# Patient Record
Sex: Female | Born: 1940 | Race: White | Hispanic: No | Marital: Single | Smoking: Never smoker
Health system: Southern US, Community
[De-identification: ages and names within clinical notes are randomized; demographics above are authoritative.]

## PROBLEM LIST (undated history)

## (undated) DIAGNOSIS — I10 Essential (primary) hypertension: Secondary | ICD-10-CM

## (undated) DIAGNOSIS — F329 Major depressive disorder, single episode, unspecified: Secondary | ICD-10-CM

## (undated) DIAGNOSIS — M179 Osteoarthritis of knee, unspecified: Secondary | ICD-10-CM

## (undated) DIAGNOSIS — M1710 Unilateral primary osteoarthritis, unspecified knee: Secondary | ICD-10-CM

## (undated) DIAGNOSIS — E785 Hyperlipidemia, unspecified: Secondary | ICD-10-CM

## (undated) DIAGNOSIS — K219 Gastro-esophageal reflux disease without esophagitis: Secondary | ICD-10-CM

## (undated) DIAGNOSIS — M792 Neuralgia and neuritis, unspecified: Secondary | ICD-10-CM

## (undated) DIAGNOSIS — F32A Depression, unspecified: Secondary | ICD-10-CM

## (undated) HISTORY — PX: BREAST SURGERY: SHX581

## (undated) HISTORY — PX: ABDOMINAL HYSTERECTOMY: SHX81

---

## 2007-10-28 ENCOUNTER — Ambulatory Visit: Payer: Self-pay | Admitting: Internal Medicine

## 2007-11-23 ENCOUNTER — Ambulatory Visit: Payer: Self-pay | Admitting: Internal Medicine

## 2007-12-28 ENCOUNTER — Ambulatory Visit: Payer: Self-pay | Admitting: Gastroenterology

## 2008-11-30 ENCOUNTER — Ambulatory Visit: Payer: Self-pay | Admitting: Family Medicine

## 2009-12-03 ENCOUNTER — Ambulatory Visit: Payer: Self-pay | Admitting: Family Medicine

## 2011-10-16 ENCOUNTER — Ambulatory Visit: Payer: Self-pay | Admitting: Family Medicine

## 2012-03-03 ENCOUNTER — Ambulatory Visit: Payer: Self-pay | Admitting: Ophthalmology

## 2012-10-13 ENCOUNTER — Ambulatory Visit: Payer: Self-pay | Admitting: Ophthalmology

## 2012-11-12 ENCOUNTER — Ambulatory Visit: Payer: Self-pay | Admitting: Emergency Medicine

## 2013-05-08 ENCOUNTER — Ambulatory Visit: Payer: Self-pay | Admitting: Physician Assistant

## 2013-05-08 DIAGNOSIS — R42 Dizziness and giddiness: Secondary | ICD-10-CM

## 2013-07-21 ENCOUNTER — Ambulatory Visit: Payer: Self-pay | Admitting: Specialist

## 2013-10-04 ENCOUNTER — Ambulatory Visit: Payer: Self-pay | Admitting: Family Medicine

## 2014-04-11 ENCOUNTER — Ambulatory Visit: Payer: Self-pay | Admitting: Family Medicine

## 2014-06-29 ENCOUNTER — Ambulatory Visit: Payer: Self-pay | Admitting: Physician Assistant

## 2015-01-16 ENCOUNTER — Ambulatory Visit: Payer: Self-pay

## 2015-02-20 ENCOUNTER — Other Ambulatory Visit: Payer: Self-pay | Admitting: Unknown Physician Specialty

## 2015-02-20 DIAGNOSIS — M1711 Unilateral primary osteoarthritis, right knee: Secondary | ICD-10-CM

## 2015-02-20 DIAGNOSIS — M25561 Pain in right knee: Secondary | ICD-10-CM

## 2015-02-28 ENCOUNTER — Ambulatory Visit: Payer: Self-pay

## 2015-03-09 ENCOUNTER — Ambulatory Visit
Admission: RE | Admit: 2015-03-09 | Discharge: 2015-03-09 | Disposition: A | Discharge status: Home / Self Care | Payer: Medicare Other | Source: Ambulatory Visit | Attending: Unknown Physician Specialty | Admitting: Unknown Physician Specialty

## 2015-03-09 DIAGNOSIS — S83241A Other tear of medial meniscus, current injury, right knee, initial encounter: Secondary | ICD-10-CM | POA: Diagnosis not present

## 2015-03-09 DIAGNOSIS — X58XXXA Exposure to other specified factors, initial encounter: Secondary | ICD-10-CM | POA: Insufficient documentation

## 2015-03-09 DIAGNOSIS — M1711 Unilateral primary osteoarthritis, right knee: Secondary | ICD-10-CM | POA: Diagnosis present

## 2015-03-09 DIAGNOSIS — M25561 Pain in right knee: Secondary | ICD-10-CM

## 2015-04-26 ENCOUNTER — Encounter: Payer: Self-pay | Admitting: *Deleted

## 2015-04-26 ENCOUNTER — Ambulatory Visit
Admission: EM | Admit: 2015-04-26 | Discharge: 2015-04-26 | Disposition: A | Discharge status: Home / Self Care | Payer: Medicare Other | Attending: Family Medicine | Admitting: Family Medicine

## 2015-04-26 ENCOUNTER — Ambulatory Visit (INDEPENDENT_AMBULATORY_CARE_PROVIDER_SITE_OTHER): Payer: Medicare Other

## 2015-04-26 DIAGNOSIS — S93401A Sprain of unspecified ligament of right ankle, initial encounter: Secondary | ICD-10-CM

## 2015-04-26 DIAGNOSIS — S8001XA Contusion of right knee, initial encounter: Secondary | ICD-10-CM | POA: Diagnosis not present

## 2015-04-26 HISTORY — DX: Neuralgia and neuritis, unspecified: M79.2

## 2015-04-26 HISTORY — DX: Hyperlipidemia, unspecified: E78.5

## 2015-04-26 HISTORY — DX: Major depressive disorder, single episode, unspecified: F32.9

## 2015-04-26 HISTORY — DX: Unilateral primary osteoarthritis, unspecified knee: M17.10

## 2015-04-26 HISTORY — DX: Essential (primary) hypertension: I10

## 2015-04-26 HISTORY — DX: Gastro-esophageal reflux disease without esophagitis: K21.9

## 2015-04-26 HISTORY — DX: Depression, unspecified: F32.A

## 2015-04-26 HISTORY — DX: Osteoarthritis of knee, unspecified: M17.9

## 2015-04-26 MED ORDER — OXYCODONE-ACETAMINOPHEN 5-325 MG PO TABS: 1.0000 | ORAL_TABLET | Freq: Three times a day (TID) | ORAL | Status: AC | PRN

## 2015-04-26 NOTE — ED Provider Notes (Signed)
[facility]  ____________________________________________  Time seen: Approximately 2:26 PM  I have reviewed the triage vital signs and the nursing notes.   HISTORY  Chief Complaint Ankle Pain and Knee Injury   HPI  April Ramirez is a 74 y.o. female presents for the complaints of right lower leg pain. Patient reports at approximately at 10 AM this morning she had went to [place] and states as she was leaving, and she did not pay attention to the step down off the curb and fell. Patient states that she was walking forward and right leg step off of the curb causing her to roll her right ankle and then landed on her right knee. Denies head injury or loss consciousness. Patient reports she was able to partially catch herself with her hands. Denies other injury.  Patient reports that she was able to get herself up. Reports has been able to ambulate since but with pain primarily to right knee. Reports walked in to urgent care.   Patient states that pain at this time is 7 out of 10 aching and throbbing. Reports pain is worse with palpation or walking.  Reports felt fine just prior to the fall. Again denies head injury or loss of consciousness. Denies neck or back pain or injury. Denies other extremity injury. Denies chest pain, shortness of breath, abdominal pain, neck or back pain, dizziness, weakness, syncope or near syncope.  Patient reports she does not fall on a regular basis. Patient reports last fall was 3 years ago which at that time she had tripped and leading to a wrist fracture.  PCP: [facility]. Orthopedic: Gavin Potters   Past Medical History  Diagnosis Date  . Hypertension   . GERD (gastroesophageal reflux disease)   . Neuralgia   . Hyperlipemia   . Depression   . Osteoarthritis of knee     right side    There are no active problems to display for this patient.   Past Surgical History  Procedure Laterality Date  . Breast surgery      implants  . Abdominal  hysterectomy      Current Outpatient Rx  Name  Route  Sig  Dispense  Refill  . amLODipine (NORVASC) 5 MG tablet   Oral   Take 5 mg by mouth daily.         Marland Kitchen aspirin 81 MG tablet   Oral   Take 81 mg by mouth daily.         Marland Kitchen atorvastatin (LIPITOR) 40 MG tablet   Oral   Take 40 mg by mouth daily.         . budesonide-formoterol (SYMBICORT) 80-4.5 MCG/ACT inhaler   Inhalation   Inhale 2 puffs into the lungs 2 (two) times daily.         Marland Kitchen FLUoxetine (PROZAC) 10 MG tablet   Oral   Take 10 mg by mouth daily.         Marland Kitchen gabapentin (NEURONTIN) 300 MG capsule   Oral   Take 300 mg by mouth 3 (three) times daily.         Marland Kitchen ibuprofen (ADVIL,MOTRIN) 200 MG tablet   Oral   Take 200 mg by mouth every 6 (six) hours as needed.         Marland Kitchen omeprazole (PRILOSEC) 20 MG capsule   Oral   Take 20 mg by mouth daily.         . traMADol (ULTRAM) 50 MG tablet   Oral   Take  by mouth every 6 (six) hours as needed.         . Vitamin D, Ergocalciferol, (DRISDOL) 50000 UNITS CAPS capsule   Oral   Take 50,000 Units by mouth every 7 (seven) days.           Allergies Contrast media  History reviewed. No pertinent family history.  Social History Social History  Substance Use Topics  . Smoking status: Never Smoker   . Smokeless tobacco: Never Used  . Alcohol Use: Yes    Review of Systems Constitutional: No fever/chills Eyes: No visual changes. ENT: No sore throat. Cardiovascular: Denies chest pain. Respiratory: Denies shortness of breath. Gastrointestinal: No abdominal pain.  No nausea, no vomiting.  No diarrhea.  No constipation. Genitourinary: Negative for dysuria. Musculoskeletal: Negative for back pain. Positive right knee and right ankle pain. Skin: Negative for rash. Neurological: Negative for headaches, focal weakness or numbness.  10-point ROS otherwise negative.  ____________________________________________   PHYSICAL EXAM:  VITAL SIGNS: ED Triage  Vitals  Enc Vitals Group     BP 04/26/15 1251 147/69 mmHg     Pulse Rate 04/26/15 1251 80     Resp 04/26/15 1251 18     Temp 04/26/15 1251 97.7 F (36.5 C)     Temp Source 04/26/15 1251 Oral     SpO2 04/26/15 1251 98 %     Weight 04/26/15 1251 160 lb (72.576 kg)     Height 04/26/15 1251 5' 2.5" (1.588 m)     Head Cir --      Peak Flow --      Pain Score 04/26/15 1306 10     Pain Loc --      Pain Edu? --      Excl. in GC? --     Constitutional: Alert and oriented. Well appearing and in no acute distress. Eyes: Conjunctivae are normal. PERRL. EOMI. Head: Atraumatic. No swelling. No ecchymosis. Nontender.  Nose: No congestion/rhinnorhea.  Mouth/Throat: Mucous membranes are moist.   Neck: No stridor.  No cervical spine tenderness to palpation. Cardiovascular: Normal rate, regular rhythm. Grossly normal heart sounds.  Good peripheral circulation. Respiratory: Normal respiratory effort.  No retractions. Lungs CTAB. No wheezes, rales or rhonchi. Good air movement. Gastrointestinal: Soft and nontender. No distention. Normal Bowel sounds.   Musculoskeletal: No lower or upper extremity tenderness nor edema. Bilateral pedal pulses equal and easily palpated. No cervical, thoracic or lumbar tenderness to palpation. 5 out of 5 strength to bilateral upper and lower extremities. Gait not tested due to pain. Except: Right anterior knee and medial knee mild to mod TTP with mild swelling and ecchymosis, full ROM present to right knee but pain with direct palpation, pain also present with anterior and posterior drawer test, no pain with medial and lateral stress test.  Lateral and medial ankle and mild lateral foot TTP, with mild swelling, no ecchymosis, full ROM, pain with ankle rotation, mild pain with plantar flexion and dorsiflexion.  Neurologic:  Normal speech and language. No gross focal neurologic deficits are appreciated. GCS 15. Skin:  Skin is warm, dry and intact. No rash noted. Psychiatric:  Mood and affect are normal. Speech and behavior are normal.  ____________________________________________   LABS (all labs ordered are listed, but only abnormal results are displayed)  Labs Reviewed - No data to display ____________________________________________  RADIOLOGY   EXAM: RIGHT KNEE - COMPLETE 4+ VIEW  COMPARISON: MRI 03/09/2015  FINDINGS: Mild joint space narrowing in the medial compartment. No acute bony abnormality.  Specifically, no fracture, subluxation, or dislocation. Soft tissues are intact. No joint effusion.  IMPRESSION: No acute bony abnormality.   Electronically Signed By: Charlett NoseKevin Dover M.D. On: 04/26/2015 14:47          DG Tibia/Fibula Right (Final result) Result time: 04/26/15 14:47:49   Final result by Rad Results In Interface (04/26/15 14:47:49)   Narrative:   CLINICAL DATA: Pt stepped off a curb and fell today. She is having pain on the medial side of her right knee and both sides of her right ankle. Her ankle is swollen and bruised. She had a MRI of her right knee 1 month ago and was told she had arthritis in her right knee.  EXAM: RIGHT TIBIA AND FIBULA - 2 VIEW  COMPARISON: None.  FINDINGS: No fracture. No bone lesion. Knee and ankle joints are normally spaced and aligned.  The soft tissues are unremarkable.  IMPRESSION: Negative.   Electronically Signed By: Amie Portlandavid Ormond M.D. On: 04/26/2015 14:47          DG Foot Complete Right (Final result) Result time: 04/26/15 14:47:36   Final result by Rad Results In Interface (04/26/15 14:47:36)   Narrative:   CLINICAL DATA: Stepped off curb, fell today. Ankle pain.  EXAM: RIGHT FOOT COMPLETE - 3+ VIEW  COMPARISON: None.  FINDINGS: Plantar calcaneal spur. No acute bony abnormality. Specifically, no fracture, subluxation, or dislocation. Soft tissues are intact.  IMPRESSION: No acute bony abnormality.   Electronically Signed By: Charlett NoseKevin  Dover M.D. On: 04/26/2015 14:47    I, Renford DillsLindsey Kari Montero, personally discussed these images and results by phone with the on-call radiologist and used this discussion as part of my medical decision making.   ____________________________________________   PROCEDURES  Procedure(s) performed:  Right knee immobilizer and right ankle velcro stirrup splint applied by RN. Neurovascular intact post application.  ____________________________________________   INITIAL IMPRESSION / ASSESSMENT AND PLAN / ED COURSE  Pertinent labs & imaging results that were available during my care of the patient were reviewed by me and considered in my medical decision making (see chart for details).  Very well-appearing patient. No acute distress. Presents for the complaints of right lower extremity pain post mechanical fall just prior to arrival. Patient reports that she did not see the curb and step-off of the causing her to roll her ankle and fell directly onto the knee. Denies head injury or loss consciousness. Reports has remained ambulatory since but with pain. No cervical, thoracic or lumbar tenderness to palpation. Will evaluate x-rays.  Right tibia-fibula x-ray as well as right foot x-ray and right knee negative for acute bony abnormalities. Right knee immobilizer for support and stability as well as per patient request ankle Velcro stirrup splint applied by RN. Prescription given for walker. Directed regarding rest and ice. Patient reports that [facility]. Patient reports that she does have home tramadol however reports that does not help well for her pain. Counseled regarding use of pain medication. Percocet quantity #9 given an directed do not take multiple pain medications together. Patient verbalizes understanding and states that she will not take Percocet and tramadol together. Over-the-counter ibuprofen needed.  Discussed follow up with Primary care physician this week.  Discussed follow up and return parameters including no resolution or any worsening concerns. Patient verbalized understanding and agreed to plan.   ____________________________________________   FINAL CLINICAL IMPRESSION(S) / ED DIAGNOSES  Final diagnoses:  Knee contusion, right, initial encounter  Right ankle sprain, initial encounter  Renford Dills, NP 04/26/15 1728

## 2015-04-26 NOTE — Discharge Instructions (Signed)
Take medication as prescribed. DO NOT take percocet in conjunction with any other pain medication. Apply ice. Rest. Use walker.   Follow up closely with your primary care physician or orthopedic as needed for continued pain. Return to Urgent care as needed for new or worsening concerns.   Contusion A contusion is a deep bruise. Contusions are the result of a blunt injury to tissues and muscle fibers under the skin. The injury causes bleeding under the skin. The skin overlying the contusion may turn blue, purple, or yellow. Minor injuries will give you a painless contusion, but more severe contusions may stay painful and swollen for a few weeks.  CAUSES  This condition is usually caused by a blow, trauma, or direct force to an area of the body. SYMPTOMS  Symptoms of this condition include:  Swelling of the injured area.  Pain and tenderness in the injured area.  Discoloration. The area may have redness and then turn blue, purple, or yellow. DIAGNOSIS  This condition is diagnosed based on a physical exam and medical history. An X-ray, CT scan, or MRI may be needed to determine if there are any associated injuries, such as broken bones (fractures). TREATMENT  Specific treatment for this condition depends on what area of the body was injured. In general, the best treatment for a contusion is resting, icing, applying pressure to (compression), and elevating the injured area. This is often called the RICE strategy. Over-the-counter anti-inflammatory medicines may also be recommended for pain control.  HOME CARE INSTRUCTIONS   Rest the injured area.  If directed, apply ice to the injured area:  Put ice in a plastic bag.  Place a towel between your skin and the bag.  Leave the ice on for 20 minutes, 2-3 times per day.  If directed, apply light compression to the injured area using an elastic bandage. Make sure the bandage is not wrapped too tightly. Remove and reapply the bandage as directed  by your health care provider.  If possible, raise (elevate) the injured area above the level of your heart while you are sitting or lying down.  Take over-the-counter and prescription medicines only as told by your health care provider. SEEK MEDICAL CARE IF:  Your symptoms do not improve after several days of treatment.  Your symptoms get worse.  You have difficulty moving the injured area. SEEK IMMEDIATE MEDICAL CARE IF:   You have severe pain.  You have numbness in a hand or foot.  Your hand or foot turns pale or cold.   This information is not intended to replace advice given to you by your health care provider. Make sure you discuss any questions you have with your health care provider.   Document Released: 01/29/2005 Document Revised: 01/10/2015 Document Reviewed: 09/06/2014 Elsevier Interactive Patient Education 2016 [employer]  Ankle Sprain An ankle sprain is an injury to the strong, fibrous tissues (ligaments) that hold the bones of your ankle joint together.  CAUSES An ankle sprain is usually caused by a fall or by twisting your ankle. Ankle sprains most commonly occur when you step on the outer edge of your foot, and your ankle turns inward. People who participate in sports are more prone to these types of injuries.  SYMPTOMS   Pain in your ankle. The pain may be present at rest or only when you are trying to stand or walk.  Swelling.  Bruising. Bruising may develop immediately or within 1 to 2 days after your injury.  Difficulty standing  or walking, particularly when turning corners or changing directions. DIAGNOSIS  Your caregiver will ask you details about your injury and perform a physical exam of your ankle to determine if you have an ankle sprain. During the physical exam, your caregiver will press on and apply pressure to specific areas of your foot and ankle. Your caregiver will try to move your ankle in certain ways. An X-ray exam may be done to be sure a  bone was not broken or a ligament did not separate from one of the bones in your ankle (avulsion fracture).  TREATMENT  Certain types of braces can help stabilize your ankle. Your caregiver can make a recommendation for this. Your caregiver may recommend the use of medicine for pain. If your sprain is severe, your caregiver may refer you to a surgeon who helps to restore function to parts of your skeletal system (orthopedist) or a physical therapist. HOME CARE INSTRUCTIONS   Apply ice to your injury for 1-2 days or as directed by your caregiver. Applying ice helps to reduce inflammation and pain.  Put ice in a plastic bag.  Place a towel between your skin and the bag.  Leave the ice on for 15-20 minutes at a time, every 2 hours while you are awake.  Only take over-the-counter or prescription medicines for pain, discomfort, or fever as directed by your caregiver.  Elevate your injured ankle above the level of your heart as much as possible for 2-3 days.  If your caregiver recommends crutches, use them as instructed. Gradually put weight on the affected ankle. Continue to use crutches or a cane until you can walk without feeling pain in your ankle.  If you have a plaster splint, wear the splint as directed by your caregiver. Do not rest it on anything harder than a pillow for the first 24 hours. Do not put weight on it. Do not get it wet. You may take it off to take a shower or bath.  You may have been given an elastic bandage to wear around your ankle to provide support. If the elastic bandage is too tight (you have numbness or tingling in your foot or your foot becomes cold and blue), adjust the bandage to make it comfortable.  If you have an air splint, you may blow more air into it or let air out to make it more comfortable. You may take your splint off at night and before taking a shower or bath. Wiggle your toes in the splint several times per day to decrease swelling. SEEK MEDICAL CARE  IF:   You have rapidly increasing bruising or swelling.  Your toes feel extremely cold or you lose feeling in your foot.  Your pain is not relieved with medicine. SEEK IMMEDIATE MEDICAL CARE IF:  Your toes are numb or blue.  You have severe pain that is increasing. MAKE SURE YOU:   Understand these instructions.  Will watch your condition.  Will get help right away if you are not doing well or get worse.   This information is not intended to replace advice given to you by your health care provider. Make sure you discuss any questions you have with your health care provider.   Document Released: 04/21/2005 Document Revised: 05/12/2014 Document Reviewed: 05/03/2011 Elsevier Interactive Patient Education [employer]

## 2015-04-26 NOTE — ED Notes (Signed)
[place] [employer] [place] right knee and twisted her right ankle. This occurred this AM. Patient does not have a history of falling.

## 2016-04-28 IMAGING — CR DG KNEE COMPLETE 4+V*R*
5 series · 5 of 5 positions shown · non-contrast
Comparison: MRI 03/09/2015

CLINICAL DATA: Stepped off curb, fell today. Medial right knee
pain.

EXAM:
RIGHT KNEE - COMPLETE 4+ VIEW

[knee ap]
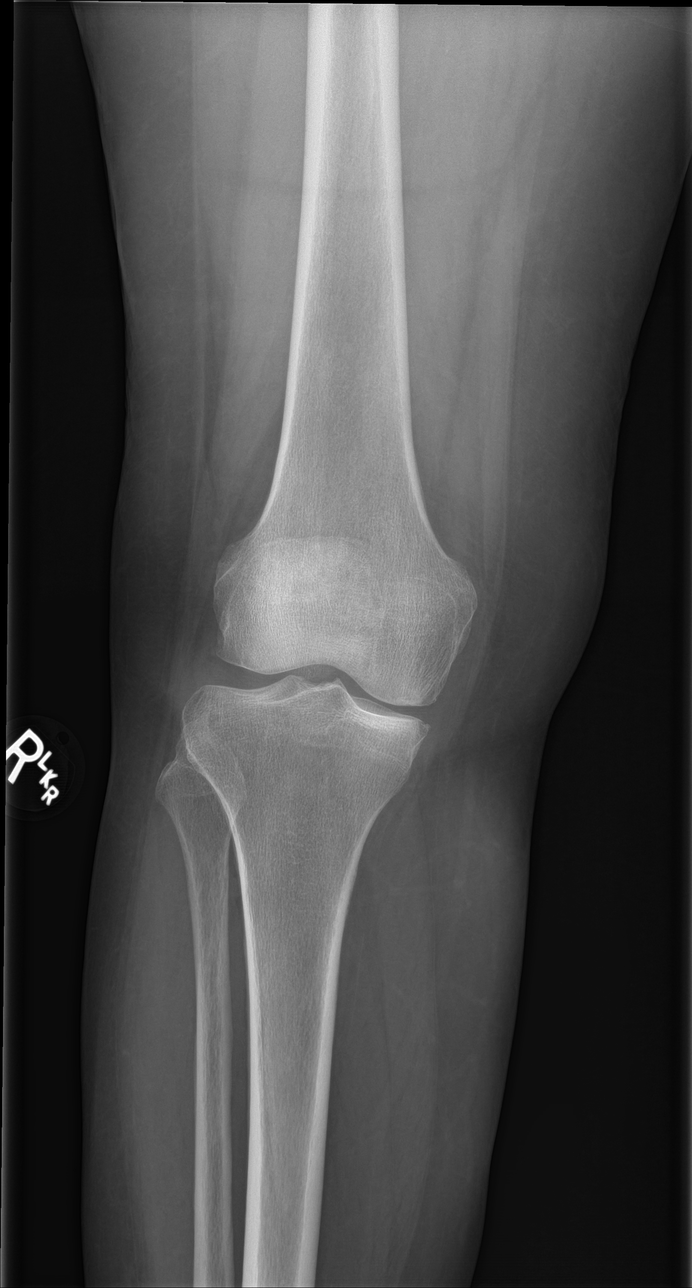

[tunnel (1 of 2)]
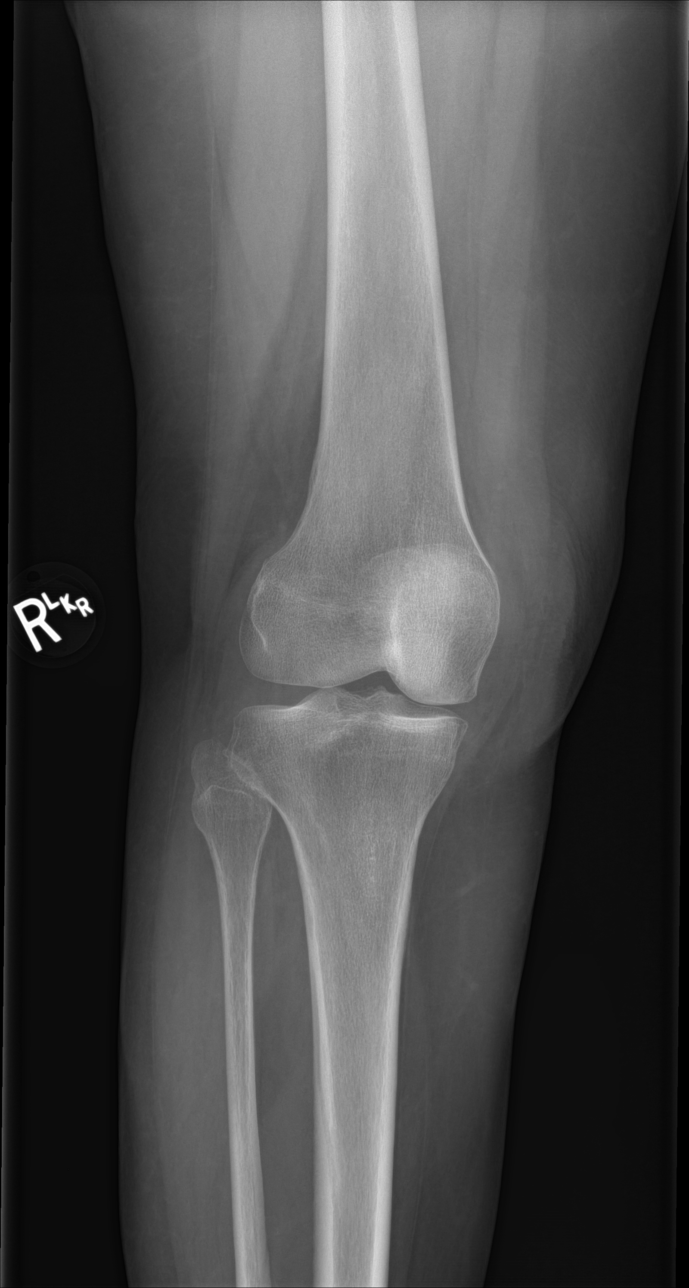

[knee lat]
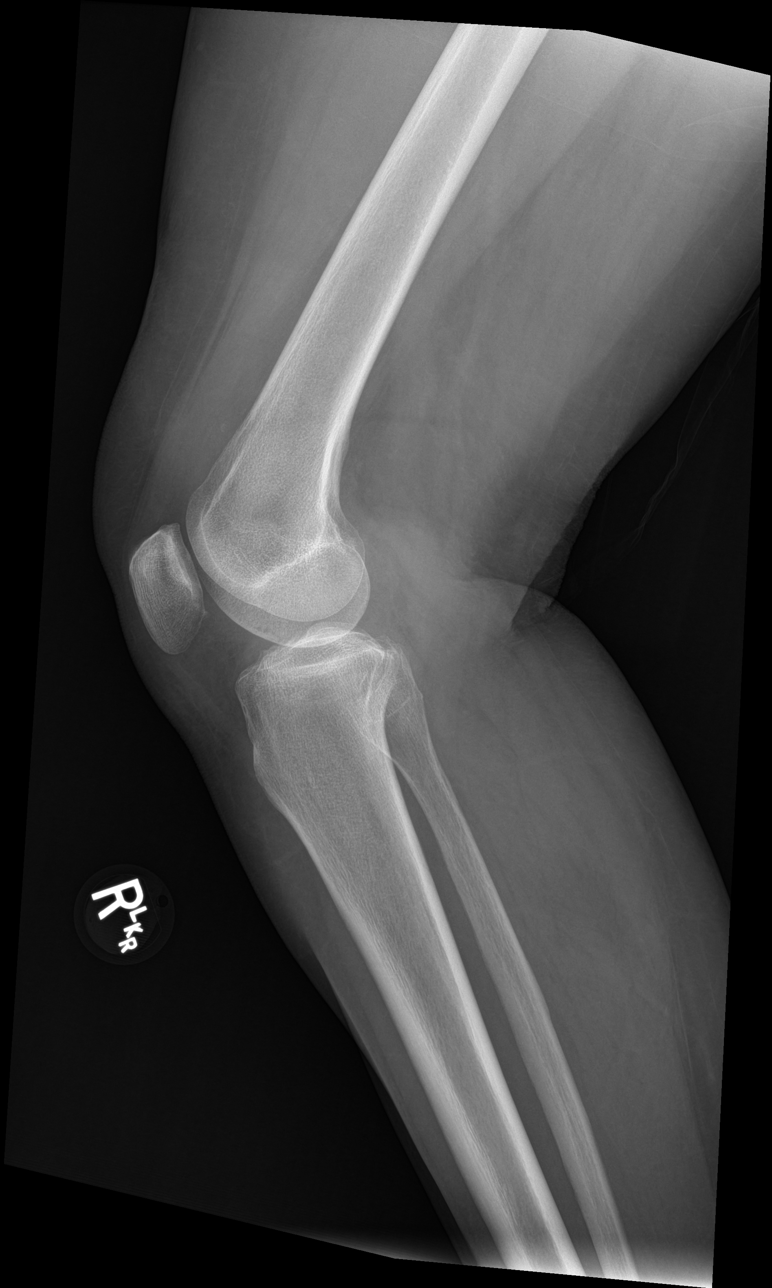

[patella skyline]
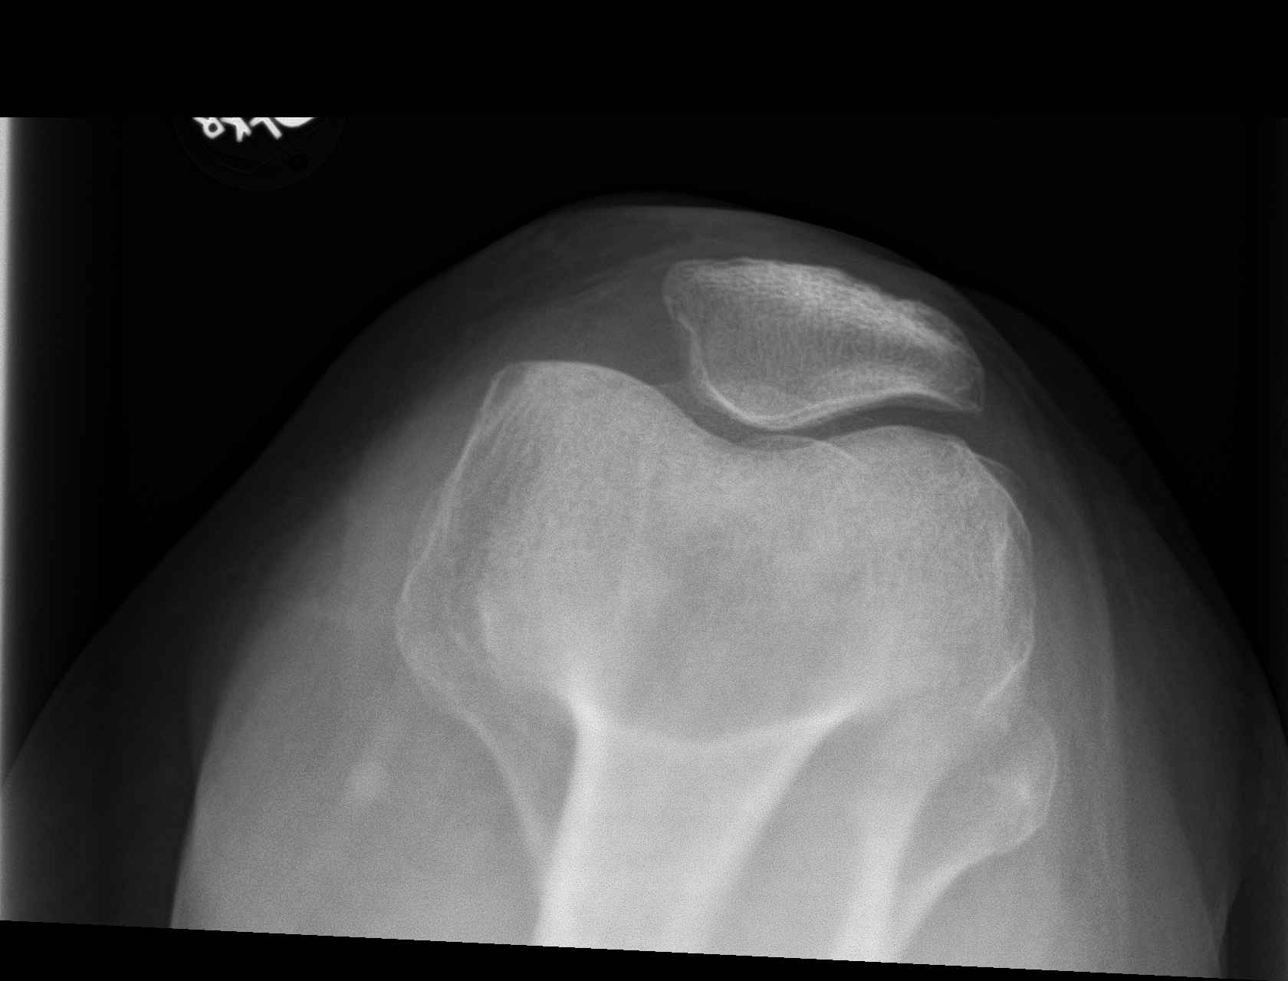

[tunnel (2 of 2)]
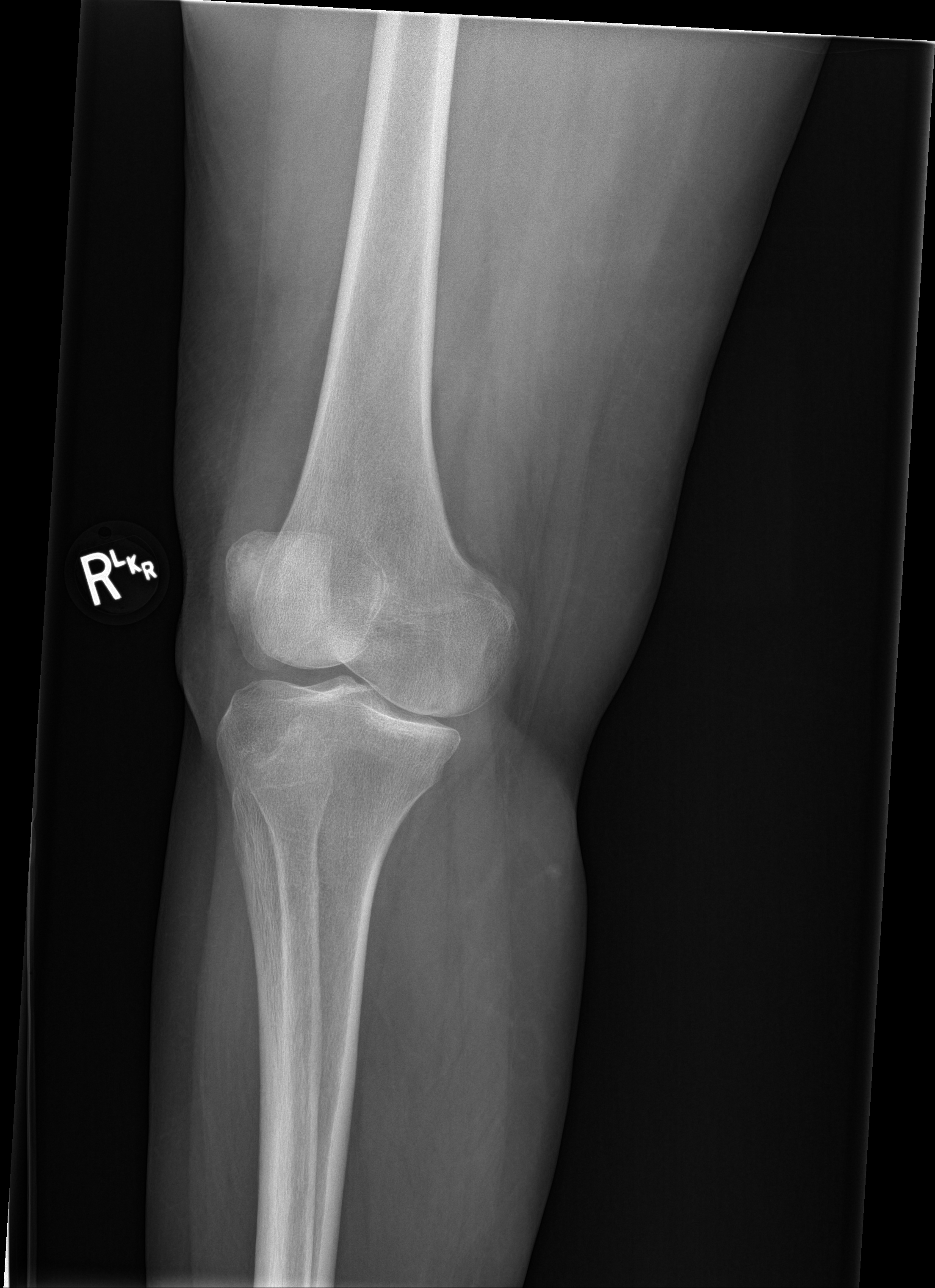

[5 of 5 positions shown; findings below may reference images not displayed]

FINDINGS: Mild joint space narrowing in the medial compartment. No acute bony
abnormality. Specifically, no fracture, subluxation, or dislocation.
Soft tissues are intact. No joint effusion.
IMPRESSION: No acute bony abnormality.

## 2017-06-30 ENCOUNTER — Other Ambulatory Visit: Payer: Self-pay | Admitting: Specialist

## 2017-06-30 ENCOUNTER — Ambulatory Visit
Admission: RE | Admit: 2017-06-30 | Discharge: 2017-06-30 | Disposition: A | Discharge status: Home / Self Care | Payer: Medicare Other | Source: Ambulatory Visit | Attending: Specialist | Admitting: Specialist

## 2017-06-30 DIAGNOSIS — J8410 Pulmonary fibrosis, unspecified: Secondary | ICD-10-CM

## 2017-06-30 DIAGNOSIS — R05 Cough: Secondary | ICD-10-CM | POA: Insufficient documentation

## 2017-06-30 DIAGNOSIS — J849 Interstitial pulmonary disease, unspecified: Secondary | ICD-10-CM | POA: Diagnosis not present

## 2017-06-30 DIAGNOSIS — R059 Cough, unspecified: Secondary | ICD-10-CM

## 2017-06-30 DIAGNOSIS — R0602 Shortness of breath: Secondary | ICD-10-CM

## 2018-03-23 ENCOUNTER — Encounter: Payer: Medicare Other | Attending: Internal Medicine

## 2018-03-23 ENCOUNTER — Other Ambulatory Visit: Payer: Self-pay

## 2018-03-23 VITALS — Ht 63.0 in | Wt 179.3 lb

## 2018-03-23 DIAGNOSIS — Z7982 Long term (current) use of aspirin: Secondary | ICD-10-CM | POA: Insufficient documentation

## 2018-03-23 DIAGNOSIS — J849 Interstitial pulmonary disease, unspecified: Secondary | ICD-10-CM | POA: Diagnosis not present

## 2018-03-23 DIAGNOSIS — K219 Gastro-esophageal reflux disease without esophagitis: Secondary | ICD-10-CM | POA: Diagnosis not present

## 2018-03-23 DIAGNOSIS — I10 Essential (primary) hypertension: Secondary | ICD-10-CM | POA: Diagnosis not present

## 2018-03-23 DIAGNOSIS — E785 Hyperlipidemia, unspecified: Secondary | ICD-10-CM | POA: Insufficient documentation

## 2018-03-23 DIAGNOSIS — Z79899 Other long term (current) drug therapy: Secondary | ICD-10-CM | POA: Diagnosis not present

## 2018-03-23 DIAGNOSIS — Z79891 Long term (current) use of opiate analgesic: Secondary | ICD-10-CM | POA: Diagnosis not present

## 2018-03-23 NOTE — Patient Instructions (Signed)
Patient Instructions  Patient Details  Name: April Ramirez MRN: [id number] Date of Birth: Jul 09, 1940 Referring Provider:  Anson Fret, MD  Below are your personal goals for exercise, nutrition, and risk factors. Our goal is to help you stay on track towards obtaining and maintaining these goals. We will be discussing your progress on these goals with you throughout the program. Initial Exercise Prescription: Initial Exercise Prescription - 03/23/18 1300      Date of Initial Exercise RX and Referring Provider   Date  03/23/18    Referring Provider  Dudley Major      Oxygen   Oxygen  Continuous    Liters  3      Treadmill   MPH  1    Grade  0    Minutes  15    METs  1.77      NuStep   Level  1    SPM  80    Minutes  15    METs  1.3      Biostep-RELP   Level  2    SPM  50    Minutes  15    METs  1.3      Prescription Details   Frequency (times per week)  3    Duration  Progress to 45 minutes of aerobic exercise without signs/symptoms of physical distress      Intensity   THRR 40-80% of Max Heartrate  109-133    Ratings of Perceived Exertion  11-13    Perceived Dyspnea  0-4      Resistance Training   Training Prescription  Yes    Weight  2 lb    Reps  10-15      Exercise Goals: Frequency: Be able to perform aerobic exercise two to three times per week in program working toward 2-5 days per week of home exercise.  Intensity: Work with a perceived exertion of 11 (fairly light) - 15 (hard) while following your exercise prescription.  We will make changes to your prescription with you as you progress through the program.   Duration: Be able to do 30 to 45 minutes of continuous aerobic exercise in addition to a 5 minute warm-up and a 5 minute cool-down routine.   Nutrition Goals: Your personal nutrition goals will be established when you do your nutrition analysis with the dietician.  The following are general nutrition guidelines to follow: Cholesterol <  200mg /day Sodium < 1500mg /day Fiber: Women over 50 yrs - 21 grams per day Personal Goals: Personal Goals and Risk Factors at Admission - 03/23/18 1107      Core Components/Risk Factors/Patient Goals on Admission    Weight Management  Yes;Weight Loss    Intervention  Weight Management: Develop a combined nutrition and exercise program designed to reach desired caloric intake, while maintaining appropriate intake of nutrient and fiber, sodium and fats, and appropriate energy expenditure required for the weight goal.;Weight Management: Provide education and appropriate resources to help participant work on and attain dietary goals.    Admit Weight  179 lb 4.8 oz (81.3 kg)    Goal Weight: Short Term  175 lb (79.4 kg)    Goal Weight: Long Term  150 lb (68 kg)    Expected Outcomes  Short Term: Continue to assess and modify interventions until short term weight is achieved;Long Term: Adherence to nutrition and physical activity/exercise program aimed toward attainment of established weight goal;Weight Maintenance: Understanding of the daily nutrition guidelines, which includes 25-35% calories from  fat, 7% or less cal from saturated fats, less than 200mg  cholesterol, less than 1.5gm of sodium, & 5 or more servings of fruits and vegetables daily;Understanding recommendations for meals to include 15-35% energy as protein, 25-35% energy from fat, 35-60% energy from carbohydrates, less than 200mg  of dietary cholesterol, 20-35 gm of total fiber daily;Understanding of distribution of calorie intake throughout the day with the consumption of 4-5 meals/snacks;Weight Loss: Understanding of general recommendations for a balanced deficit meal plan, which promotes 1-2 lb weight loss per week and includes a negative energy balance of [phone] kcal/d    Improve shortness of breath with ADL's  Yes    Intervention  Provide education, individualized exercise plan and daily activity instruction to help decrease symptoms of SOB  with activities of daily living.    Expected Outcomes  Short Term: Improve cardiorespiratory fitness to achieve a reduction of symptoms when performing ADLs;Long Term: Be able to perform more ADLs without symptoms or delay the onset of symptoms    Lipids  Yes    Intervention  Provide education and support for participant on nutrition & aerobic/resistive exercise along with prescribed medications to achieve LDL 70mg , HDL >40mg .    Expected Outcomes  Short Term: Participant states understanding of desired cholesterol values and is compliant with medications prescribed. Participant is following exercise prescription and nutrition guidelines.;Long Term: Cholesterol controlled with medications as prescribed, with individualized exercise RX and with personalized nutrition plan. Value goals: LDL < 70mg , HDL > 40 mg.      Tobacco Use Initial Evaluation: Social History   Tobacco Use  Smoking Status Never Smoker  Smokeless Tobacco Never Used   Exercise Goals and Review: Exercise Goals    Row Name 03/23/18 1304             Exercise Goals   Increase Physical Activity  Yes       Intervention  Provide advice, education, support and counseling about physical activity/exercise needs.;Develop an individualized exercise prescription for aerobic and resistive training based on initial evaluation findings, risk stratification, comorbidities and participant's personal goals.       Expected Outcomes  Short Term: Attend rehab on a regular basis to increase amount of physical activity.;Long Term: Add in home exercise to make exercise part of routine and to increase amount of physical activity.;Long Term: Exercising regularly at least 3-5 days a week.       Increase Strength and Stamina  Yes       Intervention  Provide advice, education, support and counseling about physical activity/exercise needs.;Develop an individualized exercise prescription for aerobic and resistive training based on initial evaluation  findings, risk stratification, comorbidities and participant's personal goals.       Expected Outcomes  Short Term: Increase workloads from initial exercise prescription for resistance, speed, and METs.;Short Term: Perform resistance training exercises routinely during rehab and add in resistance training at home;Long Term: Improve cardiorespiratory fitness, muscular endurance and strength as measured by increased METs and functional capacity (<MEASUREME T>)       Able to understand and use rate of perceived exertion (RPE) scale  Yes       Intervention  Provide education and explanation on how to use RPE scale       Expected Outcomes  Short Term: Able to use RPE daily in rehab to express subjective intensity level;Long Term:  Able to use RPE to guide intensity level when exercising independently       Able to understand and use Dyspnea scale  Yes  Intervention  Provide education and explanation on how to use Dyspnea scale       Expected Outcomes  Short Term: Able to use Dyspnea scale daily in rehab to express subjective sense of shortness of breath during exertion;Long Term: Able to use Dyspnea scale to guide intensity level when exercising independently       Knowledge and understanding of Target Heart Rate Range (THRR)  Yes       Intervention  Provide education and explanation of THRR including how the numbers were predicted and where they are located for reference       Expected Outcomes  Short Term: Able to state/look up THRR;Short Term: Able to use daily as guideline for intensity in rehab;Long Term: Able to use THRR to govern intensity when exercising independently       Able to check pulse independently  Yes       Intervention  Provide education and demonstration on how to check pulse in carotid and radial arteries.;Review the importance of being able to check your own pulse for safety during independent exercise       Expected Outcomes  Short Term: Able to explain why pulse checking is important  during independent exercise;Long Term: Able to check pulse independently and accurately       Understanding of Exercise Prescription  Yes       Intervention  Provide education, explanation, and written materials on patient's individual exercise prescription       Expected Outcomes  Short Term: Able to explain program exercise prescription;Long Term: Able to explain home exercise prescription to exercise independently         Copy of goals given to participant.

## 2018-03-23 NOTE — Progress Notes (Signed)
Daily Session Note  Patient Details  Name: Chosen Garron MRN: [id number] Date of Birth: [birth year] Referring Provider:     Pulmonary Rehab from 03/23/2018 in [facility]  Referring Provider  Farley Ly      Encounter Date: 03/23/2018  Check In: Session Check In - 03/23/18 1041      Check-In   Supervising physician immediately available to respond to emergencies  [facility] immediately available ER MD    Physician(s)  Dr. Reita Cliche and Mariea Clonts    Location  [facility]    Staff Present  Justin Mend RCP,RRT,BSRT;Amanda Oletta Darter, [place], ACSM CEP, Exercise Physiologist    Medication changes reported      No    Fall or balance concerns reported     No    Warm-up and Cool-down  Not performed (comment)   medical evaluation   Resistance Training Performed  No    VAD Patient?  No    PAD/SET Patient?  No      Pain Assessment   Currently in Pain?  No/denies          Social History   Tobacco Use  Smoking Status Never Smoker  Smokeless Tobacco Never Used    Goals Met:  Achieving weight loss Exercise tolerated well Personal goals reviewed Queuing for purse lip breathing No report of cardiac concerns or symptoms  Goals Unmet:  Not Applicable  Comments:  6 Minute Walk    Row Name 03/23/18 1305         6 Minute Walk   Phase  Initial     Distance  600 feet     Walk Time  5 minutes     # of Rest Breaks  1     MPH  1.36     METS  1.38     RPE  13     Perceived Dyspnea   3     VO2 Peak  4.83     Symptoms  No     Resting HR  85 bpm     Resting BP  132/78     Resting Oxygen Saturation   97 %     Exercise Oxygen Saturation  during 6 min walk  79 %     Max Ex. HR  113 bpm     Max Ex. BP  148/84     2 Minute Post BP  138/84       Interval HR   1 Minute HR  112     2 Minute HR  112     3 Minute HR  111     4 Minute HR  113     5 Minute HR  104     2 Minute Post HR  105     Interval Heart Rate?  Yes       Interval Oxygen   Interval Oxygen?  Yes     Baseline Oxygen Saturation %  97 %     1 Minute Oxygen Saturation %  86 %     1 Minute Liters of Oxygen  3 L pulsed     2 Minute Oxygen Saturation %  83 %     2 Minute Liters of Oxygen  3 L     3 Minute Oxygen Saturation %  82 %     3 Minute Liters of Oxygen  3 L     4 Minute Oxygen Saturation %  81 %     4  Minute Liters of Oxygen  3 L     5 Minute Oxygen Saturation %  79 %     5 Minute Liters of Oxygen  3 L     6 Minute Liters of Oxygen  3 L     2 Minute Post Oxygen Saturation %  92 %     2 Minute Post Liters of Oxygen  3 L       Service Time 1040-1140   Dr. Emily Filbert is Medical Director for [facility] and [facility].

## 2018-03-23 NOTE — Progress Notes (Signed)
Pulmonary Individual Treatment Plan  Patient Details  Name: April Ramirez MRN: [id number] Date of Birth: 02/21/1941 Referring Provider:     Pulmonary Rehab from 03/23/2018 in [facility] Cardiac and Pulmonary Rehab  Referring Provider  Farley Ly      Initial Encounter Date:    Pulmonary Rehab from 03/23/2018 in [facility] Cardiac and Pulmonary Rehab  Date  03/23/18      Visit Diagnosis: ILD (interstitial lung disease) ([place])  Patient's Home Medications on Admission:  Current Outpatient Medications:  .  amLODipine (NORVASC) 5 MG tablet, Take 5 mg by mouth daily., Disp: , Rfl:  .  aspirin 81 MG tablet, Take 81 mg by mouth daily., Disp: , Rfl:  .  atorvastatin (LIPITOR) 40 MG tablet, Take 40 mg by mouth daily., Disp: , Rfl:  .  budesonide-formoterol (SYMBICORT) 80-4.5 MCG/ACT inhaler, Inhale 2 puffs into the lungs 2 (two) times daily., Disp: , Rfl:  .  FLUoxetine (PROZAC) 10 MG tablet, Take 10 mg by mouth daily., Disp: , Rfl:  .  gabapentin (NEURONTIN) 300 MG capsule, Take 300 mg by mouth 3 (three) times daily., Disp: , Rfl:  .  ibuprofen (ADVIL,MOTRIN) 200 MG tablet, Take 200 mg by mouth every 6 (six) hours as needed., Disp: , Rfl:  .  omeprazole (PRILOSEC) 20 MG capsule, Take 20 mg by mouth daily., Disp: , Rfl:  .  oxyCODONE-acetaminophen (ROXICET) 5-325 MG tablet, Take 1 tablet by mouth every 8 (eight) hours as needed for moderate pain or severe pain (Do not drive or operate heavy machinery while taking as can cause drowsiness.)., Disp: 9 tablet, Rfl: 0 .  traMADol (ULTRAM) 50 MG tablet, Take by mouth every 6 (six) hours as needed., Disp: , Rfl:  .  Vitamin D, Ergocalciferol, (DRISDOL) 50000 UNITS CAPS capsule, Take 50,000 Units by mouth every 7 (seven) days., Disp: , Rfl:   Past Medical History: Past Medical History:  Diagnosis Date  . Depression   . GERD (gastroesophageal reflux disease)   . Hyperlipemia   . Hypertension   . Neuralgia   . Osteoarthritis of knee    right side     Tobacco Use: Social History   Tobacco Use  Smoking Status Never Smoker  Smokeless Tobacco Never Used    Labs: Recent Review Flowsheet Data    There is no flowsheet data to display.       Pulmonary Assessment Scores: Pulmonary Assessment Scores    Row Name 03/23/18 1059         ADL UCSD   ADL Phase  Entry     SOB Score total  70     Rest  2     Walk  3     Stairs  3     Bath  4     Dress  4     Shop  3       CAT Score   CAT Score  35       mMRC Score   mMRC Score  3        Pulmonary Function Assessment: Pulmonary Function Assessment - 03/23/18 1058      Breath   Bilateral Breath Sounds  Clear;Decreased    Shortness of Breath  Limiting activity;Yes       Exercise Target Goals: Exercise Program Goal: Individual exercise prescription set using results from initial 6 min walk test and THRR while considering  patient's activity barriers and safety.   Exercise Prescription Goal: Initial exercise prescription builds to 30-45 minutes  a day of aerobic activity, 2-3 days per week.  Home exercise guidelines will be given to patient during program as part of exercise prescription that the participant will acknowledge.  Activity Barriers & Risk Stratification:   6 Minute Walk: 6 Minute Walk    Row Name 03/23/18 1305         6 Minute Walk   Phase  Initial     Distance  600 feet     Walk Time  5 minutes     # of Rest Breaks  1     MPH  1.36     METS  1.38     RPE  13     Perceived Dyspnea   3     VO2 Peak  4.83     Symptoms  No     Resting HR  85 bpm     Resting BP  132/78     Resting Oxygen Saturation   97 %     Exercise Oxygen Saturation  during 6 min walk  79 %     Max Ex. HR  113 bpm     Max Ex. BP  148/84     2 Minute Post BP  138/84       Interval HR   1 Minute HR  112     2 Minute HR  112     3 Minute HR  111     4 Minute HR  113     5 Minute HR  104     2 Minute Post HR  105     Interval Heart Rate?  Yes       Interval Oxygen    Interval Oxygen?  Yes     Baseline Oxygen Saturation %  97 %     1 Minute Oxygen Saturation %  86 %     1 Minute Liters of Oxygen  3 L pulsed     2 Minute Oxygen Saturation %  83 %     2 Minute Liters of Oxygen  3 L     3 Minute Oxygen Saturation %  82 %     3 Minute Liters of Oxygen  3 L     4 Minute Oxygen Saturation %  81 %     4 Minute Liters of Oxygen  3 L     5 Minute Oxygen Saturation %  79 %     5 Minute Liters of Oxygen  3 L     6 Minute Liters of Oxygen  3 L     2 Minute Post Oxygen Saturation %  92 %     2 Minute Post Liters of Oxygen  3 L       Oxygen Initial Assessment: Oxygen Initial Assessment - 03/23/18 1057      Home Oxygen   Home Oxygen Device  Home Concentrator;Portable Concentrator;E-Tanks    Sleep Oxygen Prescription  CPAP    Liters per minute  0    Home Exercise Oxygen Prescription  Continuous    Liters per minute  2    Home at Rest Exercise Oxygen Prescription  Continuous    Liters per minute  2    Compliance with Home Oxygen Use  Yes      Initial 6 min Walk   Oxygen Used  Pulsed;Portable Concentrator    Liters per minute  3      Program Oxygen Prescription   Program Oxygen Prescription  Continuous  Intervention   Short Term Goals  To learn and exhibit compliance with exercise, home and travel O2 prescription;To learn and understand importance of maintaining oxygen saturations>88%;To learn and understand importance of monitoring SPO2 with pulse oximeter and demonstrate accurate use of the pulse oximeter.;To learn and demonstrate proper pursed lip breathing techniques or other breathing techniques.;To learn and demonstrate proper use of respiratory medications    Long  Term Goals  Exhibits compliance with exercise, home and travel O2 prescription;Verbalizes importance of monitoring SPO2 with pulse oximeter and return demonstration;Maintenance of O2 saturations>88%;Exhibits proper breathing techniques, such as pursed lip breathing or other method  taught during program session;Compliance with respiratory medication;Demonstrates proper use of MDI's       Oxygen Re-Evaluation:   Oxygen Discharge (Final Oxygen Re-Evaluation):   Initial Exercise Prescription: Initial Exercise Prescription - 03/23/18 1300      Date of Initial Exercise RX and Referring Provider   Date  03/23/18    Referring Provider  Farley Ly      Oxygen   Oxygen  Continuous    Liters  3      Treadmill   MPH  1    Grade  0    Minutes  15    METs  1.77      NuStep   Level  1    SPM  80    Minutes  15    METs  1.3      Biostep-RELP   Level  2    SPM  50    Minutes  15    METs  1.3      Prescription Details   Frequency (times per week)  3    Duration  Progress to 45 minutes of aerobic exercise without signs/symptoms of physical distress      Intensity   THRR 40-80% of Max Heartrate  109-133    Ratings of Perceived Exertion  11-13    Perceived Dyspnea  0-4      Resistance Training   Training Prescription  Yes    Weight  2 lb    Reps  10-15       Perform Capillary Blood Glucose checks as needed.  Exercise Prescription Changes:   Exercise Comments:   Exercise Goals and Review: Exercise Goals    Row Name 03/23/18 1304             Exercise Goals   Increase Physical Activity  Yes       Intervention  Provide advice, education, support and counseling about physical activity/exercise needs.;Develop an individualized exercise prescription for aerobic and resistive training based on initial evaluation findings, risk stratification, comorbidities and participant's personal goals.       Expected Outcomes  Short Term: Attend rehab on a regular basis to increase amount of physical activity.;Long Term: Add in home exercise to make exercise part of routine and to increase amount of physical activity.;Long Term: Exercising regularly at least 3-5 days a week.       Increase Strength and Stamina  Yes       Intervention  Provide advice, education,  support and counseling about physical activity/exercise needs.;Develop an individualized exercise prescription for aerobic and resistive training based on initial evaluation findings, risk stratification, comorbidities and participant's personal goals.       Expected Outcomes  Short Term: Increase workloads from initial exercise prescription for resistance, speed, and METs.;Short Term: Perform resistance training exercises routinely during rehab and add in resistance training at home;Long Term: Improve cardiorespiratory  fitness, muscular endurance and strength as measured by increased METs and functional capacity (6MWT)       Able to understand and use rate of perceived exertion (RPE) scale  Yes       Intervention  Provide education and explanation on how to use RPE scale       Expected Outcomes  Short Term: Able to use RPE daily in rehab to express subjective intensity level;Long Term:  Able to use RPE to guide intensity level when exercising independently       Able to understand and use Dyspnea scale  Yes       Intervention  Provide education and explanation on how to use Dyspnea scale       Expected Outcomes  Short Term: Able to use Dyspnea scale daily in rehab to express subjective sense of shortness of breath during exertion;Long Term: Able to use Dyspnea scale to guide intensity level when exercising independently       Knowledge and understanding of Target Heart Rate Range (THRR)  Yes       Intervention  Provide education and explanation of THRR including how the numbers were predicted and where they are located for reference       Expected Outcomes  Short Term: Able to state/look up THRR;Short Term: Able to use daily as guideline for intensity in rehab;Long Term: Able to use THRR to govern intensity when exercising independently       Able to check pulse independently  Yes       Intervention  Provide education and demonstration on how to check pulse in carotid and radial arteries.;Review the  importance of being able to check your own pulse for safety during independent exercise       Expected Outcomes  Short Term: Able to explain why pulse checking is important during independent exercise;Long Term: Able to check pulse independently and accurately       Understanding of Exercise Prescription  Yes       Intervention  Provide education, explanation, and written materials on patient's individual exercise prescription       Expected Outcomes  Short Term: Able to explain program exercise prescription;Long Term: Able to explain home exercise prescription to exercise independently          Exercise Goals Re-Evaluation :   Discharge Exercise Prescription (Final Exercise Prescription Changes):   Nutrition:  Target Goals: Understanding of nutrition guidelines, daily intake of sodium <1568m, cholesterol <2072m calories 30% from fat and 7% or less from saturated fats, daily to have 5 or more servings of fruits and vegetables.  Biometrics: Pre Biometrics - 03/23/18 1302      Pre Biometrics   Height  '5\' 3"'  (1.6 m)    Weight  179 lb 4.8 oz (81.3 kg)    Waist Circumference  36.5 inches    Hip Circumference  45.5 inches    Waist to Hip Ratio  0.8 %    BMI (Calculated)  31.77    Single Leg Stand  3.58 seconds        Nutrition Therapy Plan and Nutrition Goals: Nutrition Therapy & Goals - 03/23/18 1106      Personal Nutrition Goals   Nutrition Goal  Lose weight    Personal Goal #2  Cut back on sodium.    Comments  She knows she is overweight and part is predinosone. She craves salt. She drinks alot of water and does not drink soda.      Intervention Plan  Intervention  Prescribe, educate and counsel regarding individualized specific dietary modifications aiming towards targeted core components such as weight, hypertension, lipid management, diabetes, heart failure and other comorbidities.;Nutrition handout(s) given to patient.    Expected Outcomes  Short Term Goal: Understand  basic principles of dietary content, such as calories, fat, sodium, cholesterol and nutrients.;Long Term Goal: Adherence to prescribed nutrition plan.       Nutrition Assessments: Nutrition Assessments - 03/23/18 1101      MEDFICTS Scores   Pre Score  37       Nutrition Goals Re-Evaluation:   Nutrition Goals Discharge (Final Nutrition Goals Re-Evaluation):   Psychosocial: Target Goals: Acknowledge presence or absence of significant depression and/or stress, maximize coping skills, provide positive support system. Participant is able to verbalize types and ability to use techniques and skills needed for reducing stress and depression.   Initial Review & Psychosocial Screening: Initial Psych Review & Screening - 03/23/18 1103      Initial Review   Current issues with  Current Depression;Current Psychotropic Meds;Current Stress Concerns;Current Sleep Concerns;Current Anxiety/Panic    Source of Stress Concerns  Chronic Illness;Poor Coping Skills;Unable to participate in former interests or hobbies;Unable to perform yard/household activities    Comments  Her ILD is getting the best of her and she needs to learn to cope with it and manage her health better.      Family Dynamics   Good Support System?  Yes    Comments  She can look to her daughter Lattie Haw and her son in [employer]. She has three sisters that she talks to several times a week. She was working up until recently.      Barriers   Psychosocial barriers to participate in program  The patient should benefit from training in stress management and relaxation.      Screening Interventions   Interventions  Encouraged to exercise;To provide support and resources with identified psychosocial needs;Provide feedback about the scores to participant;Program counselor consult    Expected Outcomes  Short Term goal: Utilizing psychosocial counselor, staff and physician to assist with identification of specific Stressors or current issues  interfering with healing process. Setting desired goal for each stressor or current issue identified.;Long Term Goal: Stressors or current issues are controlled or eliminated.;Short Term goal: Identification and review with participant of any Quality of Life or Depression concerns found by scoring the questionnaire.;Long Term goal: The participant improves quality of Life and PHQ9 Scores as seen by post scores and/or verbalization of changes       Quality of Life Scores:  Scores of 19 and below usually indicate a poorer quality of life in these areas.  A difference of  2-3 points is a clinically meaningful difference.  A difference of 2-3 points in the total score of the Quality of Life Index has been associated with significant improvement in overall quality of life, self-image, physical symptoms, and general health in studies assessing change in quality of life.  PHQ-9: Recent Review Flowsheet Data    Depression screen [facility] 2/9 03/23/2018   Decreased Interest 2   Down, Depressed, Hopeless 2   PHQ - 2 Score 4   Altered sleeping 2   Tired, decreased energy 2   Change in appetite 3   Feeling bad or failure about yourself  2   Trouble concentrating 2   Moving slowly or fidgety/restless 3   Suicidal thoughts 2    PHQ-9 Score 20   Difficult doing work/chores Somewhat difficult  Interpretation of Total Score  Total Score Depression Severity:  1-4 = Minimal depression, 5-9 = Mild depression, 10-14 = Moderate depression, 15-19 = Moderately severe depression, 20-27 = Severe depression   Psychosocial Evaluation and Intervention:   Psychosocial Re-Evaluation:   Psychosocial Discharge (Final Psychosocial Re-Evaluation):   Education: Education Goals: Education classes will be provided on a weekly basis, covering required topics. Participant will state understanding/return demonstration of topics presented.  Learning Barriers/Preferences: Learning Barriers/Preferences - 03/23/18 1107        Learning Barriers/Preferences   Learning Barriers  None    Learning Preferences  None       Education Topics:  Initial Evaluation Education: - Verbal, written and demonstration of respiratory meds, oximetry and breathing techniques. Instruction on use of nebulizers and MDIs and importance of monitoring MDI activations.   Pulmonary Rehab from 03/23/2018 in [facility] Cardiac and Pulmonary Rehab  Date  03/23/18  Educator  [facility]  Instruction Review Code  1- Verbalizes Understanding      General Nutrition Guidelines/Fats and Fiber: -Group instruction provided by verbal, written material, models and posters to present the general guidelines for heart healthy nutrition. Gives an explanation and review of dietary fats and fiber.   Controlling Sodium/Reading Food Labels: -Group verbal and written material supporting the discussion of sodium use in heart healthy nutrition. Review and explanation with models, verbal and written materials for utilization of the food label.   Exercise Physiology & General Exercise Guidelines: - Group verbal and written instruction with models to review the exercise physiology of the cardiovascular system and associated critical values. Provides general exercise guidelines with specific guidelines to those with heart or lung disease.    Aerobic Exercise & Resistance Training: - Gives group verbal and written instruction on the various components of exercise. Focuses on aerobic and resistive training programs and the benefits of this training and how to safely progress through these programs.   Flexibility, Balance, Mind/Body Relaxation: Provides group verbal/written instruction on the benefits of flexibility and balance training, including mind/body exercise modes such as yoga, pilates and tai chi.  Demonstration and skill practice provided.   Stress and Anxiety: - Provides group verbal and written instruction about the health risks of elevated stress and  causes of high stress.  Discuss the correlation between heart/lung disease and anxiety and treatment options. Review healthy ways to manage with stress and anxiety.   Depression: - Provides group verbal and written instruction on the correlation between heart/lung disease and depressed mood, treatment options, and the stigmas associated with seeking treatment.   Exercise & Equipment Safety: - Individual verbal instruction and demonstration of equipment use and safety with use of the equipment.   Pulmonary Rehab from 03/23/2018 in [facility] Cardiac and Pulmonary Rehab  Date  03/23/18  Educator  [facility]  Instruction Review Code  1- Verbalizes Understanding      Infection Prevention: - Provides verbal and written material to individual with discussion of infection control including proper hand washing and proper equipment cleaning during exercise session.   Pulmonary Rehab from 03/23/2018 in [facility] Cardiac and Pulmonary Rehab  Date  03/23/18  Educator  [facility]  Instruction Review Code  1- Verbalizes Understanding      Falls Prevention: - Provides verbal and written material to individual with discussion of falls prevention and safety.   Pulmonary Rehab from 03/23/2018 in [facility] Cardiac and Pulmonary Rehab  Date  03/23/18  Educator  [facility]  Instruction Review Code  1- Verbalizes Understanding  Diabetes: - Individual verbal and written instruction to review signs/symptoms of diabetes, desired ranges of glucose level fasting, after meals and with exercise. Advice that pre and post exercise glucose checks will be done for 3 sessions at entry of program.   Chronic Lung Diseases: - Group verbal and written instruction to review updates, respiratory medications, advancements in procedures and treatments. Discuss use of supplemental oxygen including available portable oxygen systems, continuous and intermittent flow rates, concentrators, personal use and safety guidelines. Review proper use of inhaler and  spacers. Provide informative websites for self-education.    Energy Conservation: - Provide group verbal and written instruction for methods to conserve energy, plan and organize activities. Instruct on pacing techniques, use of adaptive equipment and posture/positioning to relieve shortness of breath.   Triggers and Exacerbations: - Group verbal and written instruction to review types of environmental triggers and ways to prevent exacerbations. Discuss weather changes, air quality and the benefits of nasal washing. Review warning signs and symptoms to help prevent infections. Discuss techniques for effective airway clearance, coughing, and vibrations.   AED/CPR: - Group verbal and written instruction with the use of models to demonstrate the basic use of the AED with the basic ABC's of resuscitation.   Anatomy and Physiology of the Lungs: - Group verbal and written instruction with the use of models to provide basic lung anatomy and physiology related to function, structure and complications of lung disease.   Anatomy & Physiology of the Heart: - Group verbal and written instruction and models provide basic cardiac anatomy and physiology, with the coronary electrical and arterial systems. Review of Valvular disease and Heart Failure   Cardiac Medications: - Group verbal and written instruction to review commonly prescribed medications for heart disease. Reviews the medication, class of the drug, and side effects.   Know Your Numbers and Risk Factors: -Group verbal and written instruction about important numbers in your health.  Discussion of what are risk factors and how they play a role in the disease process.  Review of Cholesterol, Blood Pressure, Diabetes, and BMI and the role they play in your overall health.   Sleep Hygiene: -Provides group verbal and written instruction about how sleep can affect your health.  Define sleep hygiene, discuss sleep cycles and impact of sleep  habits. Review good sleep hygiene tips.    Other: -Provides group and verbal instruction on various topics (see comments)    Knowledge Questionnaire Score: Knowledge Questionnaire Score - 03/23/18 1101      Knowledge Questionnaire Score   Pre Score  16/18   reviewed with patient       Core Components/Risk Factors/Patient Goals at Admission: Personal Goals and Risk Factors at Admission - 03/23/18 1107      Core Components/Risk Factors/Patient Goals on Admission    Weight Management  Yes;Weight Loss    Intervention  Weight Management: Develop a combined nutrition and exercise program designed to reach desired caloric intake, while maintaining appropriate intake of nutrient and fiber, sodium and fats, and appropriate energy expenditure required for the weight goal.;Weight Management: Provide education and appropriate resources to help participant work on and attain dietary goals.    Admit Weight  179 lb 4.8 oz (81.3 kg)    Goal Weight: Short Term  175 lb (79.4 kg)    Goal Weight: Long Term  150 lb (68 kg)    Expected Outcomes  Short Term: Continue to assess and modify interventions until short term weight is achieved;Long Term: Adherence to nutrition  and physical activity/exercise program aimed toward attainment of established weight goal;Weight Maintenance: Understanding of the daily nutrition guidelines, which includes 25-35% calories from fat, 7% or less cal from saturated fats, less than 264m cholesterol, less than 1.5gm of sodium, & 5 or more servings of fruits and vegetables daily;Understanding recommendations for meals to include 15-35% energy as protein, 25-35% energy from fat, 35-60% energy from carbohydrates, less than 2017mof dietary cholesterol, 20-35 gm of total fiber daily;Understanding of distribution of calorie intake throughout the day with the consumption of 4-5 meals/snacks;Weight Loss: Understanding of general recommendations for a balanced deficit meal plan, which  promotes 1-2 lb weight loss per week and includes a negative energy balance of [id number] kcal/d    Improve shortness of breath with ADL's  Yes    Intervention  Provide education, individualized exercise plan and daily activity instruction to help decrease symptoms of SOB with activities of daily living.    Expected Outcomes  Short Term: Improve cardiorespiratory fitness to achieve a reduction of symptoms when performing ADLs;Long Term: Be able to perform more ADLs without symptoms or delay the onset of symptoms    Lipids  Yes    Intervention  Provide education and support for participant on nutrition & aerobic/resistive exercise along with prescribed medications to achieve LDL <7075mHDL >57m56m  Expected Outcomes  Short Term: Participant states understanding of desired cholesterol values and is compliant with medications prescribed. Participant is following exercise prescription and nutrition guidelines.;Long Term: Cholesterol controlled with medications as prescribed, with individualized exercise RX and with personalized nutrition plan. Value goals: LDL < 70mg63mL > 40 mg.       Core Components/Risk Factors/Patient Goals Review:    Core Components/Risk Factors/Patient Goals at Discharge (Final Review):    ITP Comments: ITP Comments    Row Name 03/23/18 1042           ITP Comments  Medical Evaluation completed. Chart sent for review and changes to Dr. Mark Emily Filbertctor of [facility] can be found in Care Everywhere 03/04/2018          Comments: Initial ITP

## 2018-03-26 DIAGNOSIS — J849 Interstitial pulmonary disease, unspecified: Secondary | ICD-10-CM

## 2018-03-26 NOTE — Progress Notes (Signed)
Daily Session Note  Patient Details  Name: April Ramirez MRN: [id number] Date of Birth: 07-18-1940 Referring Provider:     Pulmonary Rehab from 03/23/2018 in [facility] Cardiac and Pulmonary Rehab  Referring Provider  Farley Ly      Encounter Date: 03/26/2018  Check In: Session Check In - 03/26/18 1148      Check-In   Supervising physician immediately available to respond to emergencies  [facility] immediately available ER MD    Physician(s)  Dr. Jimmye Norman and Quentin Cornwall    Location  [facility]-Cardiac & Pulmonary Rehab    Staff Present  Justin Mend RCP,RRT,BSRT;Amanda Oletta Darter, BA, ACSM CEP, Exercise Physiologist;Meredith Sherryll Burger, RN BSN    Medication changes reported      No    Fall or balance concerns reported     No    Warm-up and Cool-down  Performed as group-led Higher education careers adviser Performed  Yes    VAD Patient?  No    PAD/SET Patient?  No      Pain Assessment   Currently in Pain?  No/denies          Social History   Tobacco Use  Smoking Status Never Smoker  Smokeless Tobacco Never Used    Goals Met:  Exercise tolerated well Personal goals reviewed Queuing for purse lip breathing No report of cardiac concerns or symptoms Strength training completed today  Goals Unmet:  Not Applicable  Comments: First full day of exercise!  Patient was oriented to gym and equipment including functions, settings, policies, and procedures.  Patient's individual exercise prescription and treatment plan were reviewed.  All starting workloads were established based on the results of the 6 minute walk test done at initial orientation visit.  The plan for exercise progression was also introduced and progression will be customized based on patient's performance and goals.    Dr. Emily Filbert is Medical Director for [place] and [facility].

## 2018-03-29 DIAGNOSIS — J849 Interstitial pulmonary disease, unspecified: Secondary | ICD-10-CM

## 2018-03-29 NOTE — Progress Notes (Signed)
Daily Session Note  Patient Details  Name: April Ramirez MRN: [id number] Date of Birth: 10/25/1940 Referring Provider:     Pulmonary Rehab from 03/23/2018 in [facility] Cardiac and Pulmonary Rehab  Referring Provider  Farley Ly      Encounter Date: 03/29/2018  Check In: Session Check In - 03/29/18 1404      Check-In   Supervising physician immediately available to respond to emergencies  [facility] immediately available ER MD    Physician(s)  Dr. Joni Fears and Jimmye Norman    Location  [facility]-Cardiac & Pulmonary Rehab    Staff Present  Nada Maclachlan, BA, ACSM CEP, Exercise Physiologist;Keina Mutch [facility] [place], [place], ACSM CEP, Exercise Physiologist    Medication changes reported      No    Fall or balance concerns reported     No    Warm-up and Cool-down  Performed as group-led instruction    Resistance Training Performed  Yes    VAD Patient?  No    PAD/SET Patient?  No      Pain Assessment   Currently in Pain?  No/denies          Social History   Tobacco Use  Smoking Status Never Smoker  Smokeless Tobacco Never Used    Goals Met:  Exercise tolerated well No report of cardiac concerns or symptoms Strength training completed today  Goals Unmet:  Not Applicable  Comments: Pt able to follow exercise prescription today without complaint.  Will continue to monitor for progression.    Dr. Emily Filbert is Medical Director for [place] and [facility].

## 2018-03-31 DIAGNOSIS — J849 Interstitial pulmonary disease, unspecified: Secondary | ICD-10-CM

## 2018-03-31 NOTE — Progress Notes (Signed)
Daily Session Note  Patient Details  Name: April Ramirez MRN: [id number] Date of Birth: 02-Oct-1940 Referring Provider:     Pulmonary Rehab from 03/23/2018 in [facility] Cardiac and Pulmonary Rehab  Referring Provider  Farley Ly      Encounter Date: 03/31/2018  Check In: Session Check In - 03/31/18 1136      Check-In   Supervising physician immediately available to respond to emergencies  [facility] immediately available ER MD    Physician(s)  Dr. Quentin Cornwall and Darl Householder    Location  [facility]-Cardiac & Pulmonary Rehab    Staff Present  Justin Mend RCP,RRT,BSRT;Laureen Owens Shark, BS, RRT, Respiratory Therapist;Meredith Sherryll Burger, RN BSN    Medication changes reported      No    Fall or balance concerns reported     No    Warm-up and Cool-down  Performed as group-led Higher education careers adviser Performed  Yes    VAD Patient?  No    PAD/SET Patient?  No      Pain Assessment   Currently in Pain?  No/denies          Social History   Tobacco Use  Smoking Status Never Smoker  Smokeless Tobacco Never Used    Goals Met:  Independence with exercise equipment Exercise tolerated well No report of cardiac concerns or symptoms Strength training completed today  Goals Unmet:  Not Applicable  Comments: Pt able to follow exercise prescription today without complaint.  Will continue to monitor for progression.    Dr. Emily Filbert is Medical Director for [place] and [facility].

## 2018-04-05 ENCOUNTER — Encounter: Payer: Medicare Other | Attending: Internal Medicine | Admitting: *Deleted

## 2018-04-05 DIAGNOSIS — Z79891 Long term (current) use of opiate analgesic: Secondary | ICD-10-CM | POA: Diagnosis not present

## 2018-04-05 DIAGNOSIS — Z79899 Other long term (current) drug therapy: Secondary | ICD-10-CM | POA: Insufficient documentation

## 2018-04-05 DIAGNOSIS — Z7982 Long term (current) use of aspirin: Secondary | ICD-10-CM | POA: Insufficient documentation

## 2018-04-05 DIAGNOSIS — E785 Hyperlipidemia, unspecified: Secondary | ICD-10-CM | POA: Diagnosis not present

## 2018-04-05 DIAGNOSIS — I10 Essential (primary) hypertension: Secondary | ICD-10-CM | POA: Diagnosis not present

## 2018-04-05 DIAGNOSIS — K219 Gastro-esophageal reflux disease without esophagitis: Secondary | ICD-10-CM | POA: Insufficient documentation

## 2018-04-05 DIAGNOSIS — J849 Interstitial pulmonary disease, unspecified: Secondary | ICD-10-CM | POA: Insufficient documentation

## 2018-04-05 NOTE — Progress Notes (Signed)
Daily Session Note  Patient Details  Name: April Ramirez MRN: [id number] Date of Birth: 1940-05-19 Referring Provider:     Pulmonary Rehab from 03/23/2018 in [facility] Cardiac and Pulmonary Rehab  Referring Provider  Farley Ly      Encounter Date: 04/05/2018  Check In: Session Check In - 04/05/18 1215      Check-In   Supervising physician immediately available to respond to emergencies  [facility] immediately available ER MD    Physician(s)  Dr. Jimmye Norman and Joni Fears    Location  [facility]-Cardiac & Pulmonary Rehab    Staff Present  Earlean Shawl, BS, ACSM CEP, Exercise Physiologist;Joseph [facility], [place], ACSM CEP, Exercise Physiologist    Medication changes reported      No    Fall or balance concerns reported     No    Tobacco Cessation  No Change    Warm-up and Cool-down  Performed as group-led instruction    Resistance Training Performed  Yes    VAD Patient?  No    PAD/SET Patient?  No      Pain Assessment   Currently in Pain?  No/denies    Multiple Pain Sites  No          Social History   Tobacco Use  Smoking Status Never Smoker  Smokeless Tobacco Never Used    Goals Met:  Proper associated with RPD/PD & O2 Sat Independence with exercise equipment Exercise tolerated well No report of cardiac concerns or symptoms Strength training completed today  Goals Unmet:  Not Applicable  Comments: Pt able to follow exercise prescription today without complaint.  Will continue to monitor for progression.    Dr. Emily Filbert is Medical Director for [place] and [facility].

## 2018-04-14 ENCOUNTER — Encounter: Payer: Self-pay | Admitting: *Deleted

## 2018-04-14 ENCOUNTER — Telehealth: Payer: Self-pay | Admitting: *Deleted

## 2018-04-14 DIAGNOSIS — J849 Interstitial pulmonary disease, unspecified: Secondary | ICD-10-CM

## 2018-04-14 MED ORDER — ATOVAQUONE 750 MG/5ML PO SUSP
1500.00 | ORAL | Status: DC
Start: 2018-04-15 — End: 2018-04-14

## 2018-04-14 MED ORDER — ASPIRIN 81 MG PO CHEW
81.00 | CHEWABLE_TABLET | ORAL | Status: DC
Start: 2018-04-15 — End: 2018-04-14

## 2018-04-14 MED ORDER — HEPARIN (PORCINE) IN NACL 25000-0.45 UT/250ML-% IV SOLN
18.00 | INTRAVENOUS | Status: DC
Start: ? — End: 2018-04-14

## 2018-04-14 MED ORDER — GABAPENTIN 300 MG PO CAPS
900.00 | ORAL_CAPSULE | ORAL | Status: DC
Start: 2018-04-14 — End: 2018-04-14

## 2018-04-14 MED ORDER — POTASSIUM CHLORIDE CRYS ER 10 MEQ PO TBCR
40.00 | EXTENDED_RELEASE_TABLET | ORAL | Status: DC
Start: 2018-04-14 — End: 2018-04-14

## 2018-04-14 MED ORDER — CEFEPIME HCL 2 GM/100ML IV SOLN
2.00 | INTRAVENOUS | Status: DC
Start: 2018-04-14 — End: 2018-04-14

## 2018-04-14 MED ORDER — GENERIC EXTERNAL MEDICATION
30.00 | Status: DC
Start: 2018-04-15 — End: 2018-04-14

## 2018-04-14 MED ORDER — GENERIC EXTERNAL MEDICATION
500.00 | Status: DC
Start: 2018-04-15 — End: 2018-04-14

## 2018-04-14 MED ORDER — HEPARIN SODIUM (PORCINE) 1000 UNIT/ML IJ SOLN
5000.00 | INTRAMUSCULAR | Status: DC
Start: ? — End: 2018-04-14

## 2018-04-14 MED ORDER — PREDNISONE 20 MG PO TABS
60.00 | ORAL_TABLET | ORAL | Status: DC
Start: 2018-04-15 — End: 2018-04-14

## 2018-04-14 MED ORDER — MYCOPHENOLATE MOFETIL 500 MG PO TABS
1500.00 | ORAL_TABLET | ORAL | Status: DC
Start: 2018-04-14 — End: 2018-04-14

## 2018-04-14 MED ORDER — IPRATROPIUM-ALBUTEROL 0.5-2.5 (3) MG/3ML IN SOLN
3.00 | RESPIRATORY_TRACT | Status: DC
Start: 2018-04-14 — End: 2018-04-14

## 2018-04-14 MED ORDER — HYDROCODONE-HOMATROPINE 5-1.5 MG/5ML PO SYRP
5.00 | ORAL_SOLUTION | ORAL | Status: DC
Start: ? — End: 2018-04-14

## 2018-04-14 MED ORDER — ATORVASTATIN CALCIUM 20 MG PO TABS
20.00 | ORAL_TABLET | ORAL | Status: DC
Start: 2018-04-15 — End: 2018-04-14

## 2018-04-14 MED ORDER — MELATONIN 3 MG PO TABS
6.00 | ORAL_TABLET | ORAL | Status: DC
Start: ? — End: 2018-04-14

## 2018-04-14 MED ORDER — PANTOPRAZOLE SODIUM 20 MG PO TBEC
20.00 | DELAYED_RELEASE_TABLET | ORAL | Status: DC
Start: 2018-04-14 — End: 2018-04-14

## 2018-04-14 NOTE — Telephone Encounter (Signed)
Daughter called to let us know that April Ramirez is currently admitted to hospital with SOB, hypoxia.  Daughter will keep us updated on her progression.  Will hold her spot for now until we know more about her prognosis.

## 2018-04-19 DIAGNOSIS — J849 Interstitial pulmonary disease, unspecified: Secondary | ICD-10-CM

## 2018-04-19 NOTE — Progress Notes (Signed)
Pulmonary Individual Treatment Plan  Patient Details  Name: April Ramirez MRN: [id number] Date of Birth: Jan 01, 1941 Referring Provider:     Pulmonary Rehab from 03/23/2018 in [facility]  Referring Provider  Farley Ly      Initial Encounter Date:    Pulmonary Rehab from 03/23/2018 in [facility]  Date  03/23/18      Visit Diagnosis: ILD (interstitial lung disease) ([place])  Patient's Home Medications on Admission:  Current Outpatient Medications:  .  amLODipine (NORVASC) 5 MG tablet, Take 5 mg by mouth daily., Disp: , Rfl:  .  aspirin 81 MG tablet, Take 81 mg by mouth daily., Disp: , Rfl:  .  atorvastatin (LIPITOR) 40 MG tablet, Take 40 mg by mouth daily., Disp: , Rfl:  .  budesonide-formoterol (SYMBICORT) 80-4.5 MCG/ACT inhaler, Inhale 2 puffs into the lungs 2 (two) times daily., Disp: , Rfl:  .  FLUoxetine (PROZAC) 10 MG tablet, Take 10 mg by mouth daily., Disp: , Rfl:  .  gabapentin (NEURONTIN) 300 MG capsule, Take 300 mg by mouth 3 (three) times daily., Disp: , Rfl:  .  ibuprofen (ADVIL,MOTRIN) 200 MG tablet, Take 200 mg by mouth every 6 (six) hours as needed., Disp: , Rfl:  .  omeprazole (PRILOSEC) 20 MG capsule, Take 20 mg by mouth daily., Disp: , Rfl:  .  oxyCODONE-acetaminophen (ROXICET) 5-325 MG tablet, Take 1 tablet by mouth every 8 (eight) hours as needed for moderate pain or severe pain (Do not drive or operate heavy machinery while taking as can cause drowsiness.)., Disp: 9 tablet, Rfl: 0 .  traMADol (ULTRAM) 50 MG tablet, Take by mouth every 6 (six) hours as needed., Disp: , Rfl:  .  Vitamin D, Ergocalciferol, (DRISDOL) 50000 UNITS CAPS capsule, Take 50,000 Units by mouth every 7 (seven) days., Disp: , Rfl:   Past Medical History: Past Medical History:  Diagnosis Date  . Depression   . GERD (gastroesophageal reflux disease)   . Hyperlipemia   . Hypertension   . Neuralgia   . Osteoarthritis of knee    right side     Tobacco Use: Social History   Tobacco Use  Smoking Status Never Smoker  Smokeless Tobacco Never Used    Labs: Recent Review Flowsheet Data    There is no flowsheet data to display.       Pulmonary Assessment Scores: Pulmonary Assessment Scores    Row Name 03/23/18 1059         ADL UCSD   ADL Phase  Entry     SOB Score total  70     Rest  2     Walk  3     Stairs  3     Bath  4     Dress  4     Shop  3       CAT Score   CAT Score  35       mMRC Score   mMRC Score  3        Pulmonary Function Assessment: Pulmonary Function Assessment - 03/23/18 1058      Breath   Bilateral Breath Sounds  Clear;Decreased    Shortness of Breath  Limiting activity;Yes       Exercise Target Goals: Exercise Program Goal: Individual exercise prescription set using results from initial 6 min walk test and THRR while considering  patient's activity barriers and safety.   Exercise Prescription Goal: Initial exercise prescription builds to 30-45 minutes  a day of aerobic activity, 2-3 days per week.  Home exercise guidelines will be given to patient during program as part of exercise prescription that the participant will acknowledge.  Activity Barriers & Risk Stratification:   6 Minute Walk: 6 Minute Walk    Row Name 03/23/18 1305         6 Minute Walk   Phase  Initial     Distance  600 feet     Walk Time  5 minutes     # of Rest Breaks  1     MPH  1.36     METS  1.38     RPE  13     Perceived Dyspnea   3     VO2 Peak  4.83     Symptoms  No     Resting HR  85 bpm     Resting BP  132/78     Resting Oxygen Saturation   97 %     Exercise Oxygen Saturation  during 6 min walk  79 %     Max Ex. HR  113 bpm     Max Ex. BP  148/84     2 Minute Post BP  138/84       Interval HR   1 Minute HR  112     2 Minute HR  112     3 Minute HR  111     4 Minute HR  113     5 Minute HR  104     2 Minute Post HR  105     Interval Heart Rate?  Yes       Interval Oxygen    Interval Oxygen?  Yes     Baseline Oxygen Saturation %  97 %     1 Minute Oxygen Saturation %  86 %     1 Minute Liters of Oxygen  3 L pulsed     2 Minute Oxygen Saturation %  83 %     2 Minute Liters of Oxygen  3 L     3 Minute Oxygen Saturation %  82 %     3 Minute Liters of Oxygen  3 L     4 Minute Oxygen Saturation %  81 %     4 Minute Liters of Oxygen  3 L     5 Minute Oxygen Saturation %  79 %     5 Minute Liters of Oxygen  3 L     6 Minute Liters of Oxygen  3 L     2 Minute Post Oxygen Saturation %  92 %     2 Minute Post Liters of Oxygen  3 L       Oxygen Initial Assessment: Oxygen Initial Assessment - 03/23/18 1057      Home Oxygen   Home Oxygen Device  Home Concentrator;Portable Concentrator;E-Tanks    Sleep Oxygen Prescription  CPAP    Liters per minute  0    Home Exercise Oxygen Prescription  Continuous    Liters per minute  2    Home at Rest Exercise Oxygen Prescription  Continuous    Liters per minute  2    Compliance with Home Oxygen Use  Yes      Initial 6 min Walk   Oxygen Used  Pulsed;Portable Concentrator    Liters per minute  3      Program Oxygen Prescription   Program Oxygen Prescription  Continuous  Intervention   Short Term Goals  To learn and exhibit compliance with exercise, home and travel O2 prescription;To learn and understand importance of maintaining oxygen saturations>88%;To learn and understand importance of monitoring SPO2 with pulse oximeter and demonstrate accurate use of the pulse oximeter.;To learn and demonstrate proper pursed lip breathing techniques or other breathing techniques.;To learn and demonstrate proper use of respiratory medications    Long  Term Goals  Exhibits compliance with exercise, home and travel O2 prescription;Verbalizes importance of monitoring SPO2 with pulse oximeter and return demonstration;Maintenance of O2 saturations>88%;Exhibits proper breathing techniques, such as pursed lip breathing or other method  taught during program session;Compliance with respiratory medication;Demonstrates proper use of MDI's       Oxygen Re-Evaluation: Oxygen Re-Evaluation    Row Name 03/26/18 1149             Program Oxygen Prescription   Program Oxygen Prescription  Continuous       Liters per minute  3         Home Oxygen   Home Oxygen Device  Home Concentrator;Portable Concentrator;E-Tanks       Sleep Oxygen Prescription  CPAP       Liters per minute  0       Home Exercise Oxygen Prescription  Continuous       Liters per minute  2       Home at Rest Exercise Oxygen Prescription  Continuous       Liters per minute  2       Compliance with Home Oxygen Use  Yes         Goals/Expected Outcomes   Short Term Goals  To learn and exhibit compliance with exercise, home and travel O2 prescription;To learn and understand importance of maintaining oxygen saturations>88%;To learn and understand importance of monitoring SPO2 with pulse oximeter and demonstrate accurate use of the pulse oximeter.;To learn and demonstrate proper pursed lip breathing techniques or other breathing techniques.;To learn and demonstrate proper use of respiratory medications       Long  Term Goals  Exhibits compliance with exercise, home and travel O2 prescription;Verbalizes importance of monitoring SPO2 with pulse oximeter and return demonstration;Maintenance of O2 saturations>88%;Exhibits proper breathing techniques, such as pursed lip breathing or other method taught during program session;Compliance with respiratory medication;Demonstrates proper use of MDI's       Comments  Reviewed PLB technique with pt.  Talked about how it work and it's important to maintaining his exercise saturations.         Goals/Expected Outcomes  Short: Become more profiecient at using PLB.   Long: Become independent at using PLB.          Oxygen Discharge (Final Oxygen Re-Evaluation): Oxygen Re-Evaluation - 03/26/18 1149      Program Oxygen  Prescription   Program Oxygen Prescription  Continuous    Liters per minute  3      Home Oxygen   Home Oxygen Device  Home Concentrator;Portable Concentrator;E-Tanks    Sleep Oxygen Prescription  CPAP    Liters per minute  0    Home Exercise Oxygen Prescription  Continuous    Liters per minute  2    Home at Rest Exercise Oxygen Prescription  Continuous    Liters per minute  2    Compliance with Home Oxygen Use  Yes      Goals/Expected Outcomes   Short Term Goals  To learn and exhibit compliance with exercise, home and travel O2 prescription;To learn and  understand importance of maintaining oxygen saturations>88%;To learn and understand importance of monitoring SPO2 with pulse oximeter and demonstrate accurate use of the pulse oximeter.;To learn and demonstrate proper pursed lip breathing techniques or other breathing techniques.;To learn and demonstrate proper use of respiratory medications    Long  Term Goals  Exhibits compliance with exercise, home and travel O2 prescription;Verbalizes importance of monitoring SPO2 with pulse oximeter and return demonstration;Maintenance of O2 saturations>88%;Exhibits proper breathing techniques, such as pursed lip breathing or other method taught during program session;Compliance with respiratory medication;Demonstrates proper use of MDI's    Comments  Reviewed PLB technique with pt.  Talked about how it work and it's important to maintaining his exercise saturations.      Goals/Expected Outcomes  Short: Become more profiecient at using PLB.   Long: Become independent at using PLB.       Initial Exercise Prescription: Initial Exercise Prescription - 03/23/18 1300      Date of Initial Exercise RX and Referring Provider   Date  03/23/18    Referring Provider  Farley Ly      Oxygen   Oxygen  Continuous    Liters  3      Treadmill   MPH  1    Grade  0    Minutes  15    METs  1.77      NuStep   Level  1    SPM  80    Minutes  15    METs  1.3       Biostep-RELP   Level  2    SPM  50    Minutes  15    METs  1.3      Prescription Details   Frequency (times per week)  3    Duration  Progress to 45 minutes of aerobic exercise without signs/symptoms of physical distress      Intensity   THRR 40-80% of Max Heartrate  109-133    Ratings of Perceived Exertion  11-13    Perceived Dyspnea  0-4      Resistance Training   Training Prescription  Yes    Weight  2 lb    Reps  10-15       Perform Capillary Blood Glucose checks as needed.  Exercise Prescription Changes: Exercise Prescription Changes    Row Name 04/06/18 1300             Response to Exercise   Blood Pressure (Admit)  148/90       Blood Pressure (Exercise)  148/84       Blood Pressure (Exit)  128/80       Heart Rate (Admit)  108 bpm       Heart Rate (Exercise)  111 bpm       Heart Rate (Exit)  89 bpm       Oxygen Saturation (Admit)  90 %       Oxygen Saturation (Exercise)  87 %       Oxygen Saturation (Exit)  96 %       Rating of Perceived Exertion (Exercise)  15       Perceived Dyspnea (Exercise)  3       Symptoms  none       Duration  Progress to 45 minutes of aerobic exercise without signs/symptoms of physical distress       Intensity  THRR unchanged         Progression   Progression  Continue to progress workloads  to maintain intensity without signs/symptoms of physical distress.       Average METs  2.1         Resistance Training   Training Prescription  Yes       Weight  3 lbs       Reps  10-15         Interval Training   Interval Training  No         Oxygen   Oxygen  Continuous       Liters  3         Treadmill   MPH  1.1       Grade  0.5       Minutes  15       METs  1.92         NuStep   Level  1       Minutes  15       METs  2.4         Biostep-RELP   Level  2       Minutes  15       METs  2          Exercise Comments: Exercise Comments    Row Name 03/26/18 1149           Exercise Comments  First full day of  exercise!  Patient was oriented to gym and equipment including functions, settings, policies, and procedures.  Patient's individual exercise prescription and treatment plan were reviewed.  All starting workloads were established based on the results of the 6 minute walk test done at initial orientation visit.  The plan for exercise progression was also introduced and progression will be customized based on patient's performance and goals.          Exercise Goals and Review: Exercise Goals    Row Name 03/23/18 1304             Exercise Goals   Increase Physical Activity  Yes       Intervention  Provide advice, education, support and counseling about physical activity/exercise needs.;Develop an individualized exercise prescription for aerobic and resistive training based on initial evaluation findings, risk stratification, comorbidities and participant's personal goals.       Expected Outcomes  Short Term: Attend rehab on a regular basis to increase amount of physical activity.;Long Term: Add in home exercise to make exercise part of routine and to increase amount of physical activity.;Long Term: Exercising regularly at least 3-5 days a week.       Increase Strength and Stamina  Yes       Intervention  Provide advice, education, support and counseling about physical activity/exercise needs.;Develop an individualized exercise prescription for aerobic and resistive training based on initial evaluation findings, risk stratification, comorbidities and participant's personal goals.       Expected Outcomes  Short Term: Increase workloads from initial exercise prescription for resistance, speed, and METs.;Short Term: Perform resistance training exercises routinely during rehab and add in resistance training at home;Long Term: Improve cardiorespiratory fitness, muscular endurance and strength as measured by increased METs and functional capacity (6MWT)       Able to understand and use rate of perceived  exertion (RPE) scale  Yes       Intervention  Provide education and explanation on how to use RPE scale       Expected Outcomes  Short Term: Able to use RPE daily in rehab to express subjective intensity level;Long Term:  Able to use RPE to guide intensity level when exercising independently       Able to understand and use Dyspnea scale  Yes       Intervention  Provide education and explanation on how to use Dyspnea scale       Expected Outcomes  Short Term: Able to use Dyspnea scale daily in rehab to express subjective sense of shortness of breath during exertion;Long Term: Able to use Dyspnea scale to guide intensity level when exercising independently       Knowledge and understanding of Target Heart Rate Range (THRR)  Yes       Intervention  Provide education and explanation of THRR including how the numbers were predicted and where they are located for reference       Expected Outcomes  Short Term: Able to state/look up THRR;Short Term: Able to use daily as guideline for intensity in rehab;Long Term: Able to use THRR to govern intensity when exercising independently       Able to check pulse independently  Yes       Intervention  Provide education and demonstration on how to check pulse in carotid and radial arteries.;Review the importance of being able to check your own pulse for safety during independent exercise       Expected Outcomes  Short Term: Able to explain why pulse checking is important during independent exercise;Long Term: Able to check pulse independently and accurately       Understanding of Exercise Prescription  Yes       Intervention  Provide education, explanation, and written materials on patient's individual exercise prescription       Expected Outcomes  Short Term: Able to explain program exercise prescription;Long Term: Able to explain home exercise prescription to exercise independently          Exercise Goals Re-Evaluation : Exercise Goals Re-Evaluation    Row Name  03/26/18 1134 04/06/18 1321           Exercise Goal Re-Evaluation   Exercise Goals Review  Increase Physical Activity;Increase Strength and Stamina;Able to understand and use rate of perceived exertion (RPE) scale;Knowledge and understanding of Target Heart Rate Range (THRR);Understanding of Exercise Prescription  Increase Physical Activity;Increase Strength and Stamina;Understanding of Exercise Prescription      Comments  Reviewed RPE scale, THR and program prescription with pt today.  Pt voiced understanding and was given a copy of goals to take home.   April Ramirez is off to a good start in rehab.  She has completed her first four days of exercise.  We will start to increase her workloads and continue to monitor her progression.       Expected Outcomes  Short: Use RPE daily to regulate intensity. Long: Follow program prescription in THR.  Short: Increase workloads.  Long: Continue to improve strength and stamina.         Discharge Exercise Prescription (Final Exercise Prescription Changes): Exercise Prescription Changes - 04/06/18 1300      Response to Exercise   Blood Pressure (Admit)  148/90    Blood Pressure (Exercise)  148/84    Blood Pressure (Exit)  128/80    Heart Rate (Admit)  108 bpm    Heart Rate (Exercise)  111 bpm    Heart Rate (Exit)  89 bpm    Oxygen Saturation (Admit)  90 %    Oxygen Saturation (Exercise)  87 %    Oxygen Saturation (Exit)  96 %    Rating of Perceived  Exertion (Exercise)  15    Perceived Dyspnea (Exercise)  3    Symptoms  none    Duration  Progress to 45 minutes of aerobic exercise without signs/symptoms of physical distress    Intensity  THRR unchanged      Progression   Progression  Continue to progress workloads to maintain intensity without signs/symptoms of physical distress.    Average METs  2.1      Resistance Training   Training Prescription  Yes    Weight  3 lbs    Reps  10-15      Interval Training   Interval Training  No      Oxygen    Oxygen  Continuous    Liters  3      Treadmill   MPH  1.1    Grade  0.5    Minutes  15    METs  1.92      NuStep   Level  1    Minutes  15    METs  2.4      Biostep-RELP   Level  2    Minutes  15    METs  2       Nutrition:  Target Goals: Understanding of nutrition guidelines, daily intake of sodium <1573m, cholesterol <2073m calories 30% from fat and 7% or less from saturated fats, daily to have 5 or more servings of fruits and vegetables.  Biometrics: Pre Biometrics - 03/23/18 1302      Pre Biometrics   Height  '5\' 3"'  (1.6 m)    Weight  179 lb 4.8 oz (81.3 kg)    Waist Circumference  36.5 inches    Hip Circumference  45.5 inches    Waist to Hip Ratio  0.8 %    BMI (Calculated)  31.77    Single Leg Stand  3.58 seconds        Nutrition Therapy Plan and Nutrition Goals: Nutrition Therapy & Goals - 03/31/18 1325      Nutrition Therapy   Diet  DASH    Drug/Food Interactions  Statins/Certain Fruits    Protein (specify units)  6-7oz    Fiber  20 grams    Whole Grain Foods  3 servings   does not always choose whole grains   Saturated Fats  11 max. grams    Fruits and Vegetables  4 servings/day   8 ideal; eats fruits "Every few days" and does not always eat 3 meals/day   Sodium  1500 grams      Personal Nutrition Goals   Nutrition Goal  Practice eating on a more consistent schedule each day to help regulate appetite and reduce cravings    Personal Goal #2  Become more aware of the amount of sodium you are consuming. 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 States she "craves" salt but knows she needs to start reducing the amount of salt in her diet; used to salt food before eating and even salted her  fruit. She keeps high-sodium snacks like chips and peanuts by her chair at home. She does not eat on a regular schedule, sometimes eating 1 meal/day sometimes eating 2 and a few  snacks. Meal items include options like eggs, toast, cereal, pizza, spaghetti, steak once per week, apple and cheese or cheese and crackers, celery and peanut butter, cottage cheese, pork, salmon, beans, yogurt, soup. Beverages: water, juice/ soda sparingly, hot tea. She may have a fruit as a snack every few days. She lives with her daughter and her family who will sometimes cook dinners for her (which are sodium free before pt adds salt). Does not typically buy canned items or frozen meals      Intervention Plan   Intervention  Prescribe, educate and counsel regarding individualized specific dietary modifications aiming towards targeted core components such as weight, hypertension, lipid management, diabetes, heart failure and other comorbidities.    Expected Outcomes  Short Term Goal: Understand basic principles of dietary content, such as calories, fat, sodium, cholesterol and nutrients.;Short Term Goal: A plan has been developed with personal nutrition goals set during dietitian appointment.;Long Term Goal: Adherence to prescribed nutrition plan.       Nutrition Assessments: Nutrition Assessments - 03/23/18 1101      MEDFICTS Scores   Pre Score  37       Nutrition Goals Re-Evaluation: Nutrition Goals Re-Evaluation    [place] Name 03/31/18 1348             Goals   Nutrition Goal  Become more aware of the amount of sodium you are consuming. Start to read food labels and to taste food before adding salt       Comment  Up until this point she ate a diet high in sodium. She "craves" salt, adding it to all foods and choosing high salt snacks like nuts and chips often       Expected Outcome  She will start to become aware of the amount of salt she is eating, and start to reduce it as a short term goal (not adding extra  salt to meals for example). Long term goal: buy low sodium snack options         Personal Goal #2 Re-Evaluation   Personal Goal #2  Practice eating on a more consistent schedule each day to help regulate appetite and reduce cravings         Personal Goal #3 Re-Evaluation   Personal Goal #3  Eat at least one additional serving of fruit per week          Nutrition Goals Discharge (Final Nutrition Goals Re-Evaluation): Nutrition Goals Re-Evaluation - 03/31/18 1348      Goals   Nutrition Goal  Become more aware of the amount of sodium you are consuming. Start to read food labels and to taste food before adding salt    Comment  Up until this point she ate a diet high in sodium. She "craves" salt, adding it to all foods and choosing high salt snacks like nuts and chips often    Expected Outcome  She will start to become aware of the amount of salt she is eating, and start to reduce it as a short term goal (not adding extra salt to meals for example). Long term goal: buy low sodium snack options      Personal Goal #2 Re-Evaluation   Personal Goal #2  Practice eating on a more consistent schedule each day to help regulate appetite and reduce cravings      Personal Goal #3 Re-Evaluation   Personal Goal #3  Eat at least one additional serving of fruit per week       Psychosocial: Target  Goals: Acknowledge presence or absence of significant depression and/or stress, maximize coping skills, provide positive support system. Participant is able to verbalize types and ability to use techniques and skills needed for reducing stress and depression.   Initial Review & Psychosocial Screening: Initial Psych Review & Screening - 03/23/18 1103      Initial Review   Current issues with  Current Depression;Current Psychotropic Meds;Current Stress Concerns;Current Sleep Concerns;Current Anxiety/Panic    Source of Stress Concerns  Chronic Illness;Poor Coping Skills;Unable to participate in former interests  or hobbies;Unable to perform yard/household activities    Comments  Her ILD is getting the best of her and she needs to learn to cope with it and manage her health better.      Family Dynamics   Good Support System?  Yes    Comments  She can look to her daughter Lattie Haw and her son in [employer]. She has three sisters that she talks to several times a week. She was working up until recently.      Barriers   Psychosocial barriers to participate in program  The patient should benefit from training in stress management and relaxation.      Screening Interventions   Interventions  Encouraged to exercise;To provide support and resources with identified psychosocial needs;Provide feedback about the scores to participant;Program counselor consult    Expected Outcomes  Short Term goal: Utilizing psychosocial counselor, staff and physician to assist with identification of specific Stressors or current issues interfering with healing process. Setting desired goal for each stressor or current issue identified.;Long Term Goal: Stressors or current issues are controlled or eliminated.;Short Term goal: Identification and review with participant of any Quality of Life or Depression concerns found by scoring the questionnaire.;Long Term goal: The participant improves quality of Life and PHQ9 Scores as seen by post scores and/or verbalization of changes       Quality of Life Scores:  Scores of 19 and below usually indicate a poorer quality of life in these areas.  A difference of  2-3 points is a clinically meaningful difference.  A difference of 2-3 points in the total score of the Quality of Life Index has been associated with significant improvement in overall quality of life, self-image, physical symptoms, and general health in studies assessing change in quality of life.  PHQ-9: Recent Review Flowsheet Data    Depression screen [facility] 2/9 03/23/2018   Decreased Interest 2   Down, Depressed, Hopeless 2   PHQ - 2  Score 4   Altered sleeping 2   Tired, decreased energy 2   Change in appetite 3   Feeling bad or failure about yourself  2   Trouble concentrating 2   Moving slowly or fidgety/restless 3   Suicidal thoughts 2    PHQ-9 Score 20   Difficult doing work/chores Somewhat difficult     Interpretation of Total Score  Total Score Depression Severity:  1-4 = Minimal depression, 5-9 = Mild depression, 10-14 = Moderate depression, 15-19 = Moderately severe depression, 20-27 = Severe depression   Psychosocial Evaluation and Intervention:   Psychosocial Re-Evaluation:   Psychosocial Discharge (Final Psychosocial Re-Evaluation):   Education: Education Goals: Education classes will be provided on a weekly basis, covering required topics. Participant will state understanding/return demonstration of topics presented.  Learning Barriers/Preferences: Learning Barriers/Preferences - 03/23/18 1107      Learning Barriers/Preferences   Learning Barriers  None    Learning Preferences  None       Education  Topics:  Initial Evaluation Education: - Verbal, written and demonstration of respiratory meds, oximetry and breathing techniques. Instruction on use of nebulizers and MDIs and importance of monitoring MDI activations.   Pulmonary Rehab from 03/23/2018 in [facility] Cardiac and Pulmonary Rehab  Date  03/23/18  Educator  [facility]  Instruction Review Code  1- Verbalizes Understanding      General Nutrition Guidelines/Fats and Fiber: -Group instruction provided by verbal, written material, models and posters to present the general guidelines for heart healthy nutrition. Gives an explanation and review of dietary fats and fiber.   Controlling Sodium/Reading Food Labels: -Group verbal and written material supporting the discussion of sodium use in heart healthy nutrition. Review and explanation with models, verbal and written materials for utilization of the food label.   Exercise Physiology & General  Exercise Guidelines: - Group verbal and written instruction with models to review the exercise physiology of the cardiovascular system and associated critical values. Provides general exercise guidelines with specific guidelines to those with heart or lung disease.    Aerobic Exercise & Resistance Training: - Gives group verbal and written instruction on the various components of exercise. Focuses on aerobic and resistive training programs and the benefits of this training and how to safely progress through these programs.   Flexibility, Balance, Mind/Body Relaxation: Provides group verbal/written instruction on the benefits of flexibility and balance training, including mind/body exercise modes such as yoga, pilates and tai chi.  Demonstration and skill practice provided.   Stress and Anxiety: - Provides group verbal and written instruction about the health risks of elevated stress and causes of high stress.  Discuss the correlation between heart/lung disease and anxiety and treatment options. Review healthy ways to manage with stress and anxiety.   Depression: - Provides group verbal and written instruction on the correlation between heart/lung disease and depressed mood, treatment options, and the stigmas associated with seeking treatment.   Exercise & Equipment Safety: - Individual verbal instruction and demonstration of equipment use and safety with use of the equipment.   Pulmonary Rehab from 03/23/2018 in [facility] Cardiac and Pulmonary Rehab  Date  03/23/18  Educator  [facility]  Instruction Review Code  1- Verbalizes Understanding      Infection Prevention: - Provides verbal and written material to individual with discussion of infection control including proper hand washing and proper equipment cleaning during exercise session.   Pulmonary Rehab from 03/23/2018 in [facility] Cardiac and Pulmonary Rehab  Date  03/23/18  Educator  [facility]  Instruction Review Code  1- Verbalizes Understanding       Falls Prevention: - Provides verbal and written material to individual with discussion of falls prevention and safety.   Pulmonary Rehab from 03/23/2018 in [facility] Cardiac and Pulmonary Rehab  Date  03/23/18  Educator  [facility]  Instruction Review Code  1- Verbalizes Understanding      Diabetes: - Individual verbal and written instruction to review signs/symptoms of diabetes, desired ranges of glucose level fasting, after meals and with exercise. Advice that pre and post exercise glucose checks will be done for 3 sessions at entry of program.   Chronic Lung Diseases: - Group verbal and written instruction to review updates, respiratory medications, advancements in procedures and treatments. Discuss use of supplemental oxygen including available portable oxygen systems, continuous and intermittent flow rates, concentrators, personal use and safety guidelines. Review proper use of inhaler and spacers. Provide informative websites for self-education.    Energy Conservation: - Provide group verbal and written instruction for  methods to conserve energy, plan and organize activities. Instruct on pacing techniques, use of adaptive equipment and posture/positioning to relieve shortness of breath.   Triggers and Exacerbations: - Group verbal and written instruction to review types of environmental triggers and ways to prevent exacerbations. Discuss weather changes, air quality and the benefits of nasal washing. Review warning signs and symptoms to help prevent infections. Discuss techniques for effective airway clearance, coughing, and vibrations.   AED/CPR: - Group verbal and written instruction with the use of models to demonstrate the basic use of the AED with the basic ABC's of resuscitation.   Anatomy and Physiology of the Lungs: - Group verbal and written instruction with the use of models to provide basic lung anatomy and physiology related to function, structure and complications of lung  disease.   Anatomy & Physiology of the Heart: - Group verbal and written instruction and models provide basic cardiac anatomy and physiology, with the coronary electrical and arterial systems. Review of Valvular disease and Heart Failure   Cardiac Medications: - Group verbal and written instruction to review commonly prescribed medications for heart disease. Reviews the medication, class of the drug, and side effects.   Know Your Numbers and Risk Factors: -Group verbal and written instruction about important numbers in your health.  Discussion of what are risk factors and how they play a role in the disease process.  Review of Cholesterol, Blood Pressure, Diabetes, and BMI and the role they play in your overall health.   Sleep Hygiene: -Provides group verbal and written instruction about how sleep can affect your health.  Define sleep hygiene, discuss sleep cycles and impact of sleep habits. Review good sleep hygiene tips.    Other: -Provides group and verbal instruction on various topics (see comments)    Knowledge Questionnaire Score: Knowledge Questionnaire Score - 03/23/18 1101      Knowledge Questionnaire Score   Pre Score  16/18   reviewed with patient       Core Components/Risk Factors/Patient Goals at Admission: Personal Goals and Risk Factors at Admission - 03/23/18 1107      Core Components/Risk Factors/Patient Goals on Admission    Weight Management  Yes;Weight Loss    Intervention  Weight Management: Develop a combined nutrition and exercise program designed to reach desired caloric intake, while maintaining appropriate intake of nutrient and fiber, sodium and fats, and appropriate energy expenditure required for the weight goal.;Weight Management: Provide education and appropriate resources to help participant work on and attain dietary goals.    Admit Weight  179 lb 4.8 oz (81.3 kg)    Goal Weight: Short Term  175 lb (79.4 kg)    Goal Weight: Long Term  150 lb  (68 kg)    Expected Outcomes  Short Term: Continue to assess and modify interventions until short term weight is achieved;Long Term: Adherence to nutrition and physical activity/exercise program aimed toward attainment of established weight goal;Weight Maintenance: Understanding of the daily nutrition guidelines, which includes 25-35% calories from fat, 7% or less cal from saturated fats, less than 267m cholesterol, less than 1.5gm of sodium, & 5 or more servings of fruits and vegetables daily;Understanding recommendations for meals to include 15-35% energy as protein, 25-35% energy from fat, 35-60% energy from carbohydrates, less than 2020mof dietary cholesterol, 20-35 gm of total fiber daily;Understanding of distribution of calorie intake throughout the day with the consumption of 4-5 meals/snacks;Weight Loss: Understanding of general recommendations for a balanced deficit meal plan, which promotes 1-2 lb  weight loss per week and includes a negative energy balance of [phone] kcal/d    Improve shortness of breath with ADL's  Yes    Intervention  Provide education, individualized exercise plan and daily activity instruction to help decrease symptoms of SOB with activities of daily living.    Expected Outcomes  Short Term: Improve cardiorespiratory fitness to achieve a reduction of symptoms when performing ADLs;Long Term: Be able to perform more ADLs without symptoms or delay the onset of symptoms    Lipids  Yes    Intervention  Provide education and support for participant on nutrition & aerobic/resistive exercise along with prescribed medications to achieve LDL <51m, HDL >460m    Expected Outcomes  Short Term: Participant states understanding of desired cholesterol values and is compliant with medications prescribed. Participant is following exercise prescription and nutrition guidelines.;Long Term: Cholesterol controlled with medications as prescribed, with individualized exercise RX and with  personalized nutrition plan. Value goals: LDL < 7068mHDL > 40 mg.       Core Components/Risk Factors/Patient Goals Review:    Core Components/Risk Factors/Patient Goals at Discharge (Final Review):    ITP Comments: ITP Comments    Row Name 03/23/18 1042 04/14/18 0927 04/19/18 0845       ITP Comments  Medical Evaluation completed. Chart sent for review and changes to Dr. [place] in Care Everywhere 03/04/2018  Daughter called to let us [place] that [place] currently admitted to hospital with SOB, hypoxia.  Daughter will keep us [place] on her progression.  Will hold her spot for now until we know more about her prognosis.   30 day review completed. ITP sent [place] Dr[place] unless changes are made by physician.        Comments: discharge ITP

## 2018-04-19 NOTE — Progress Notes (Signed)
Discharge Progress Report  Patient Details  Name: April Ramirez MRN: [id number] Date of Birth: [birth year] Referring Provider:     Pulmonary Rehab from 03/23/2018 in [facility]  Referring Provider  Dudley Major       Number of Visits: 5/36  Reason for Discharge:  Early Exit:  Patient deceased  Smoking History:  Social History   Tobacco Use  Smoking Status Never Smoker  Smokeless Tobacco Never Used    Diagnosis:  ILD (interstitial lung disease) (HCC)  ADL [facility]: Pulmonary Assessment Scores    Row Name 03/23/18 1059         ADL [facility]   ADL Phase  Entry     SOB Score total  70     Rest  2     Walk  3     Stairs  3     Bath  4     Dress  4     Shop  3       CAT Score   CAT Score  35       mMRC Score   mMRC Score  3        Initial Exercise Prescription: Initial Exercise Prescription - 03/23/18 1300      Date of Initial Exercise RX and Referring Provider   Date  03/23/18    Referring Provider  Dudley Major      Oxygen   Oxygen  Continuous    Liters  3      Treadmill   MPH  1    Grade  0    Minutes  15    METs  1.77      NuStep   Level  1    SPM  80    Minutes  15    METs  1.3      Biostep-RELP   Level  2    SPM  50    Minutes  15    METs  1.3      Prescription Details   Frequency (times per week)  3    Duration  Progress to 45 minutes of aerobic exercise without signs/symptoms of physical distress      Intensity   THRR 40-80% of Max Heartrate  109-133    Ratings of Perceived Exertion  11-13    Perceived Dyspnea  0-4      Resistance Training   Training Prescription  Yes    Weight  2 lb    Reps  10-15       Discharge Exercise Prescription (Final Exercise Prescription Changes): Exercise Prescription Changes - 04/06/18 1300      Response to Exercise   Blood Pressure (Admit)  148/90    Blood Pressure (Exercise)  148/84    Blood Pressure (Exit)  128/80    Heart Rate (Admit)  108 bpm    Heart Rate (Exercise)   111 bpm    Heart Rate (Exit)  89 bpm    Oxygen Saturation (Admit)  90 %    Oxygen Saturation (Exercise)  87 %    Oxygen Saturation (Exit)  96 %    Rating of Perceived Exertion (Exercise)  15    Perceived Dyspnea (Exercise)  3    Symptoms  none    Duration  Progress to 45 minutes of aerobic exercise without signs/symptoms of physical distress    Intensity  THRR unchanged      Progression   Progression  Continue to progress workloads to maintain intensity  without signs/symptoms of physical distress.    Average METs  2.1      Resistance Training   Training Prescription  Yes    Weight  3 lbs    Reps  10-15      Interval Training   Interval Training  No      Oxygen   Oxygen  Continuous    Liters  3      Treadmill   MPH  1.1    Grade  0.5    Minutes  15    METs  1.92      NuStep   Level  1    Minutes  15    METs  2.4      Biostep-RELP   Level  2    Minutes  15    METs  2       Functional Capacity: 6 Minute Walk    Row Name 03/23/18 1305         6 Minute Walk   Phase  Initial     Distance  600 feet     Walk Time  5 minutes     # of Rest Breaks  1     MPH  1.36     METS  1.38     RPE  13     Perceived Dyspnea   3     VO2 Peak  4.83     Symptoms  No     Resting HR  85 bpm     Resting BP  132/78     Resting Oxygen Saturation   97 %     Exercise Oxygen Saturation  during 6 min walk  79 %     Max Ex. HR  113 bpm     Max Ex. BP  148/84     2 Minute Post BP  138/84       Interval HR   1 Minute HR  112     2 Minute HR  112     3 Minute HR  111     4 Minute HR  113     5 Minute HR  104     2 Minute Post HR  105     Interval Heart Rate?  Yes       Interval Oxygen   Interval Oxygen?  Yes     Baseline Oxygen Saturation %  97 %     1 Minute Oxygen Saturation %  86 %     1 Minute Liters of Oxygen  3 L pulsed     2 Minute Oxygen Saturation %  83 %     2 Minute Liters of Oxygen  3 L     3 Minute Oxygen Saturation %  82 %     3 Minute Liters of Oxygen  3  L     4 Minute Oxygen Saturation %  81 %     4 Minute Liters of Oxygen  3 L     5 Minute Oxygen Saturation %  79 %     5 Minute Liters of Oxygen  3 L     6 Minute Liters of Oxygen  3 L     2 Minute Post Oxygen Saturation %  92 %     2 Minute Post Liters of Oxygen  3 L        Psychological, QOL, Others - Outcomes: PHQ 2/9: [facility]  Decreased Interest 2  Down, Depressed, Hopeless 2  PHQ - 2 Score 4  Altered sleeping 2  Tired, decreased energy 2  Change in appetite 3  Feeling bad or failure about yourself  2  Trouble concentrating 2  Moving slowly or fidgety/restless 3  Suicidal thoughts 2  PHQ-9 Score 20  Difficult doing work/chores Somewhat difficult    Quality of Life:   Personal Goals: Goals established at orientation with interventions provided to work toward goal. Personal Goals and Risk Factors at Admission - 03/23/18 1107      Core Components/Risk Factors/Patient Goals on Admission    Weight Management  Yes;Weight Loss    Intervention  Weight Management: Develop a combined nutrition and exercise program designed to reach desired caloric intake, while maintaining appropriate intake of nutrient and fiber, sodium and fats, and appropriate energy expenditure required for the weight goal.;Weight Management: Provide education and appropriate resources to help participant work on and attain dietary goals.    Admit Weight  179 lb 4.8 oz (81.3 kg)    Goal Weight: Short Term  175 lb (79.4 kg)    Goal Weight: Long Term  150 lb (68 kg)    Expected Outcomes  Short Term: Continue to assess and modify interventions until short term weight is achieved;Long Term: Adherence to nutrition and physical activity/exercise program aimed toward attainment of established weight goal;Weight Maintenance: Understanding of the daily nutrition guidelines, which includes 25-35% calories from fat, 7% or less cal from saturated fats, less than 200mg  cholesterol, less than  1.5gm of sodium, & 5 or more servings of fruits and vegetables daily;Understanding recommendations for meals to include 15-35% energy as protein, 25-35% energy from fat, 35-60% energy from carbohydrates, less than 200mg  of dietary cholesterol, 20-35 gm of total fiber daily;Understanding of distribution of calorie intake throughout the day with the consumption of 4-5 meals/snacks;Weight Loss: Understanding of general recommendations for a balanced deficit meal plan, which promotes 1-2 lb weight loss per week and includes a negative energy balance of [phone] kcal/d    Improve shortness of breath with ADL's  Yes    Intervention  Provide education, individualized exercise plan and daily activity instruction to help decrease symptoms of SOB with activities of daily living.    Expected Outcomes  Short Term: Improve cardiorespiratory fitness to achieve a reduction of symptoms when performing ADLs;Long Term: Be able to perform more ADLs without symptoms or delay the onset of symptoms    Lipids  Yes    Intervention  Provide education and support for participant on nutrition & aerobic/resistive exercise along with prescribed medications to achieve LDL 70mg , HDL >40mg .    Expected Outcomes  Short Term: Participant states understanding of desired cholesterol values and is compliant with medications prescribed. Participant is following exercise prescription and nutrition guidelines.;Long Term: Cholesterol controlled with medications as prescribed, with individualized exercise RX and with personalized nutrition plan. Value goals: LDL < 70mg , HDL > 40 mg.        Personal Goals Discharge:   Exercise Goals and Review: Exercise Goals    Row Name 03/23/18 1304             Exercise Goals   Increase Physical Activity  Yes       Intervention  Provide advice, education, support and counseling about physical activity/exercise needs.;Develop an individualized exercise prescription for aerobic and resistive training  based on initial evaluation findings, risk stratification, comorbidities and participant's personal goals.       Expected Outcomes  Short Term: Attend rehab  on a regular basis to increase amount of physical activity.;Long Term: Add in home exercise to make exercise part of routine and to increase amount of physical activity.;Long Term: Exercising regularly at least 3-5 days a week.       Increase Strength and Stamina  Yes       Intervention  Provide advice, education, support and counseling about physical activity/exercise needs.;Develop an individualized exercise prescription for aerobic and resistive training based on initial evaluation findings, risk stratification, comorbidities and participant's personal goals.       Expected Outcomes  Short Term: Increase workloads from initial exercise prescription for resistance, speed, and METs.;Short Term: Perform resistance training exercises routinely during rehab and add in resistance training at home;Long Term: Improve cardiorespiratory fitness, muscular endurance and strength as measured by increased METs and functional capacity ( )       Able to understand and use rate of perceived exertion (RPE) scale  Yes       Intervention  Provide education and explanation on how to use RPE scale       Expected Outcomes  Short Term: Able to use RPE daily in rehab to express subjective intensity level;Long Term:  Able to use RPE to guide intensity level when exercising independently       Able to understand and use Dyspnea scale  Yes       Intervention  Provide education and explanation on how to use Dyspnea scale       Expected Outcomes  Short Term: Able to use Dyspnea scale daily in rehab to express subjective sense of shortness of breath during exertion;Long Term: Able to use Dyspnea scale to guide intensity level when exercising independently       Knowledge and understanding of Target Heart Rate Range (THRR)  Yes       Intervention  Provide education and  explanation of THRR including how the numbers were predicted and where they are located for reference       Expected Outcomes  Short Term: Able to state/look up THRR;Short Term: Able to use daily as guideline for intensity in rehab;Long Term: Able to use THRR to govern intensity when exercising independently       Able to check pulse independently  Yes       Intervention  Provide education and demonstration on how to check pulse in carotid and radial arteries.;Review the importance of being able to check your own pulse for safety during independent exercise       Expected Outcomes  Short Term: Able to explain why pulse checking is important during independent exercise;Long Term: Able to check pulse independently and accurately       Understanding of Exercise Prescription  Yes       Intervention  Provide education, explanation, and written materials on patient's individual exercise prescription       Expected Outcomes  Short Term: Able to explain program exercise prescription;Long Term: Able to explain home exercise prescription to exercise independently          Exercise Goals Re-Evaluation: Exercise Goals Re-Evaluation    Row Name 03/26/18 1134 04/06/18 1321           Exercise Goal Re-Evaluation   Exercise Goals Review  Increase Physical Activity;Increase Strength and Stamina;Able to understand and use rate of perceived exertion (RPE) scale;Knowledge and understanding of Target Heart Rate Range (THRR);Understanding of Exercise Prescription  Increase Physical Activity;Increase Strength and Stamina;Understanding of Exercise Prescription      Comments  Reviewed RPE  scale, THR and program prescription with pt today.  Pt voiced understanding and was given a copy of goals to take home.   Gearl is off to a good start in rehab.  She has completed her first four days of exercise.  We will start to increase her workloads and continue to monitor her progression.       Expected Outcomes  Short: Use RPE  daily to regulate intensity. Long: Follow program prescription in THR.  Short: Increase workloads.  Long: Continue to improve strength and stamina.         Nutrition & Weight - Outcomes: Pre Biometrics - 03/23/18 1302      Pre Biometrics   Height  5\' 3"  (1.6 m)    Weight  179 lb 4.8 oz (81.3 kg)    Waist Circumference  36.5 inches    Hip Circumference  45.5 inches    Waist to Hip Ratio  0.8 %    BMI (Calculated)  31.77    Single Leg Stand  3.58 seconds        Nutrition: Nutrition Therapy & Goals - 03/31/18 1325      Nutrition Therapy   Diet  DASH    Drug/Food Interactions  Statins/Certain Fruits    Protein (specify units)  6-7oz    Fiber  20 grams    Whole Grain Foods  3 servings   does not always choose whole grains   Saturated Fats  11 max. grams    Fruits and Vegetables  4 servings/day   8 ideal; eats fruits "Every few days" and does not always eat 3 meals/day   Sodium  1500 grams      Personal Nutrition Goals   Nutrition Goal  Practice eating on a more consistent schedule each day to help regulate appetite and reduce cravings    Personal Goal #2  Become more aware of the amount of sodium you are consuming. 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 States she "craves" salt but knows she needs to start reducing the amount of salt in her diet; used to salt food before eating and even salted her fruit. She keeps high-sodium snacks like chips and peanuts by her chair at home. She does not eat on a regular schedule, sometimes eating 1 meal/day sometimes eating 2 and a few snacks. Meal items include options like eggs, toast, cereal, pizza, spaghetti, steak once per week, apple and cheese or cheese and crackers, celery and  peanut butter, cottage cheese, pork, salmon, beans, yogurt, soup. Beverages: water, juice/ soda sparingly, hot tea. She may have a fruit as a snack every few days. She lives with her daughter and her family who will sometimes cook dinners for her (which are sodium free before pt adds salt). Does not typically buy canned items or frozen meals      Intervention Plan   Intervention  Prescribe, educate and counsel regarding individualized specific dietary modifications aiming towards targeted core components such as weight, hypertension, lipid management, diabetes, heart failure and other comorbidities.    Expected Outcomes  Short Term Goal: Understand basic principles of dietary content, such as calories, fat, sodium, cholesterol and nutrients.;Short Term Goal: A plan has  been developed with personal nutrition goals set during dietitian appointment.;Long Term Goal: Adherence to prescribed nutrition plan.       Nutrition Discharge: Nutrition Assessments - 03/23/18 1101      MEDFICTS Scores   Pre Score  37       Education Questionnaire Score: Knowledge Questionnaire Score - 03/23/18 1101      Knowledge Questionnaire Score   Pre Score  16/18   reviewed with patient      Goals reviewed with patient; copy given to patient.

## 2018-05-05 DEATH — deceased

## 2018-07-03 IMAGING — CR DG CHEST 2V
2 series · 2 of 2 positions shown · non-contrast
Comparison: 05/08/2013

CLINICAL DATA: Cough and shortness of breath.

EXAM:
CHEST  2 VIEW

[chest lat]
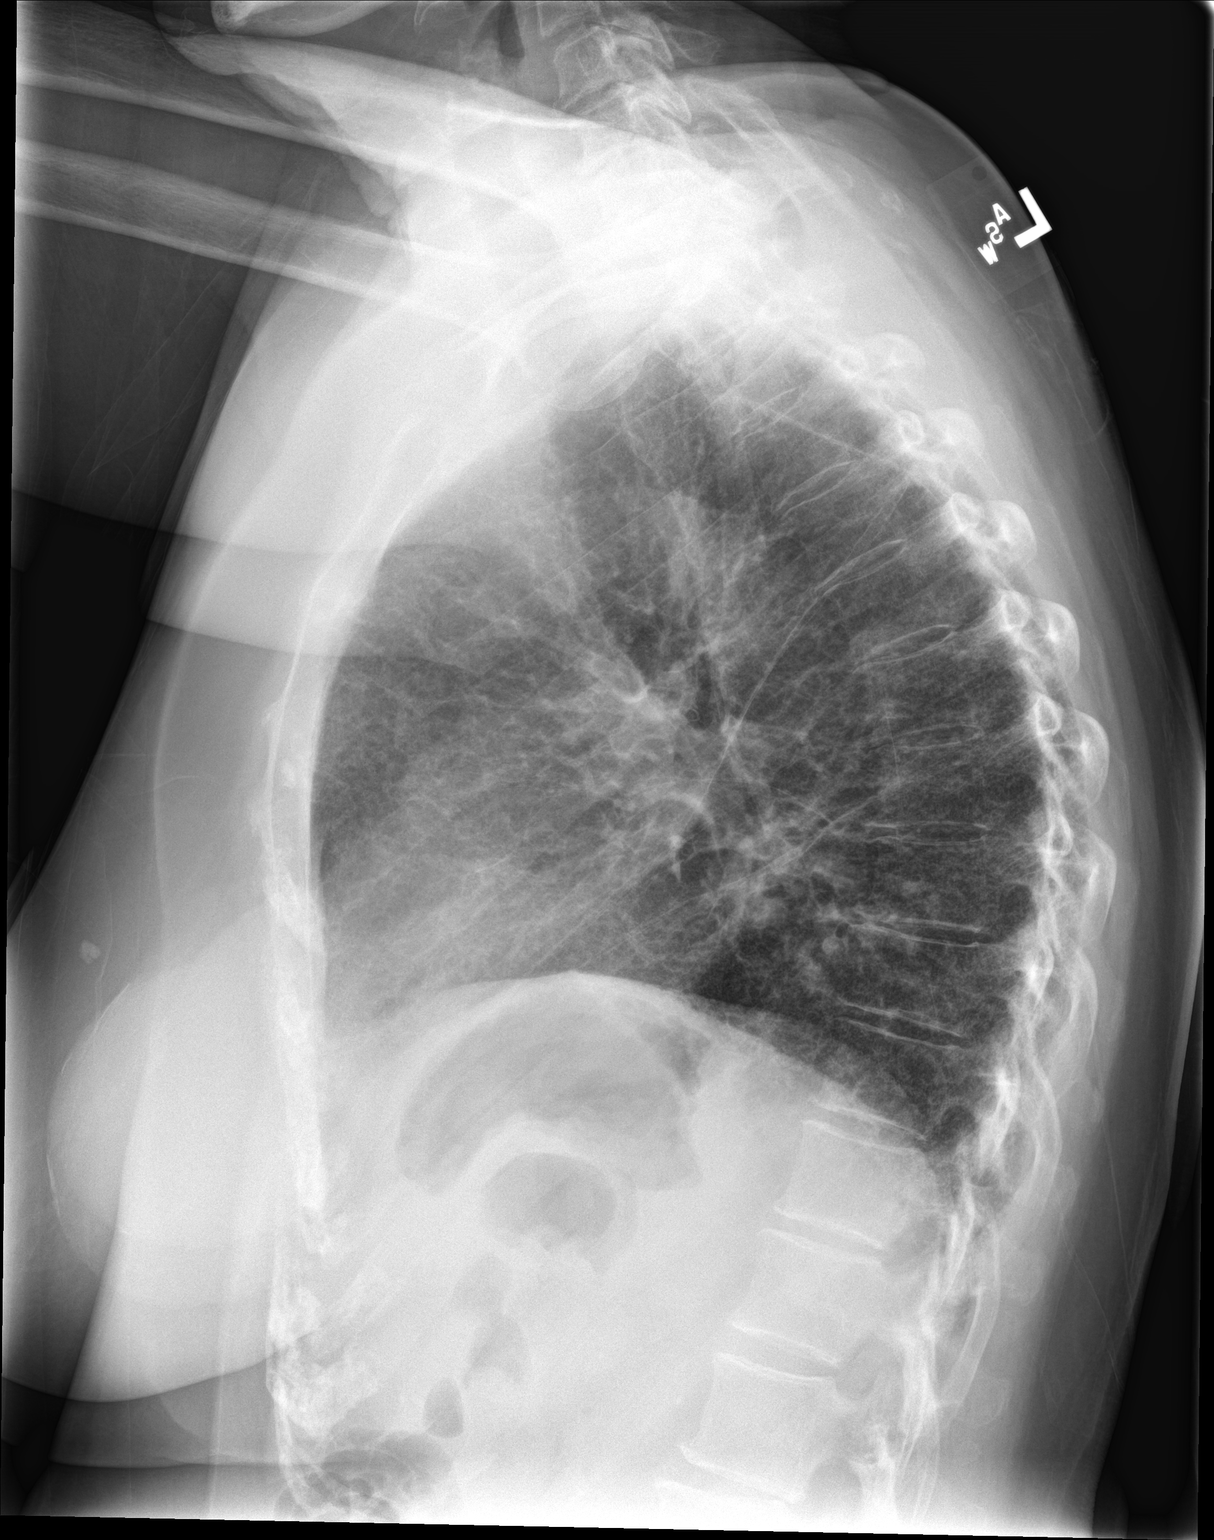

[chest pa]
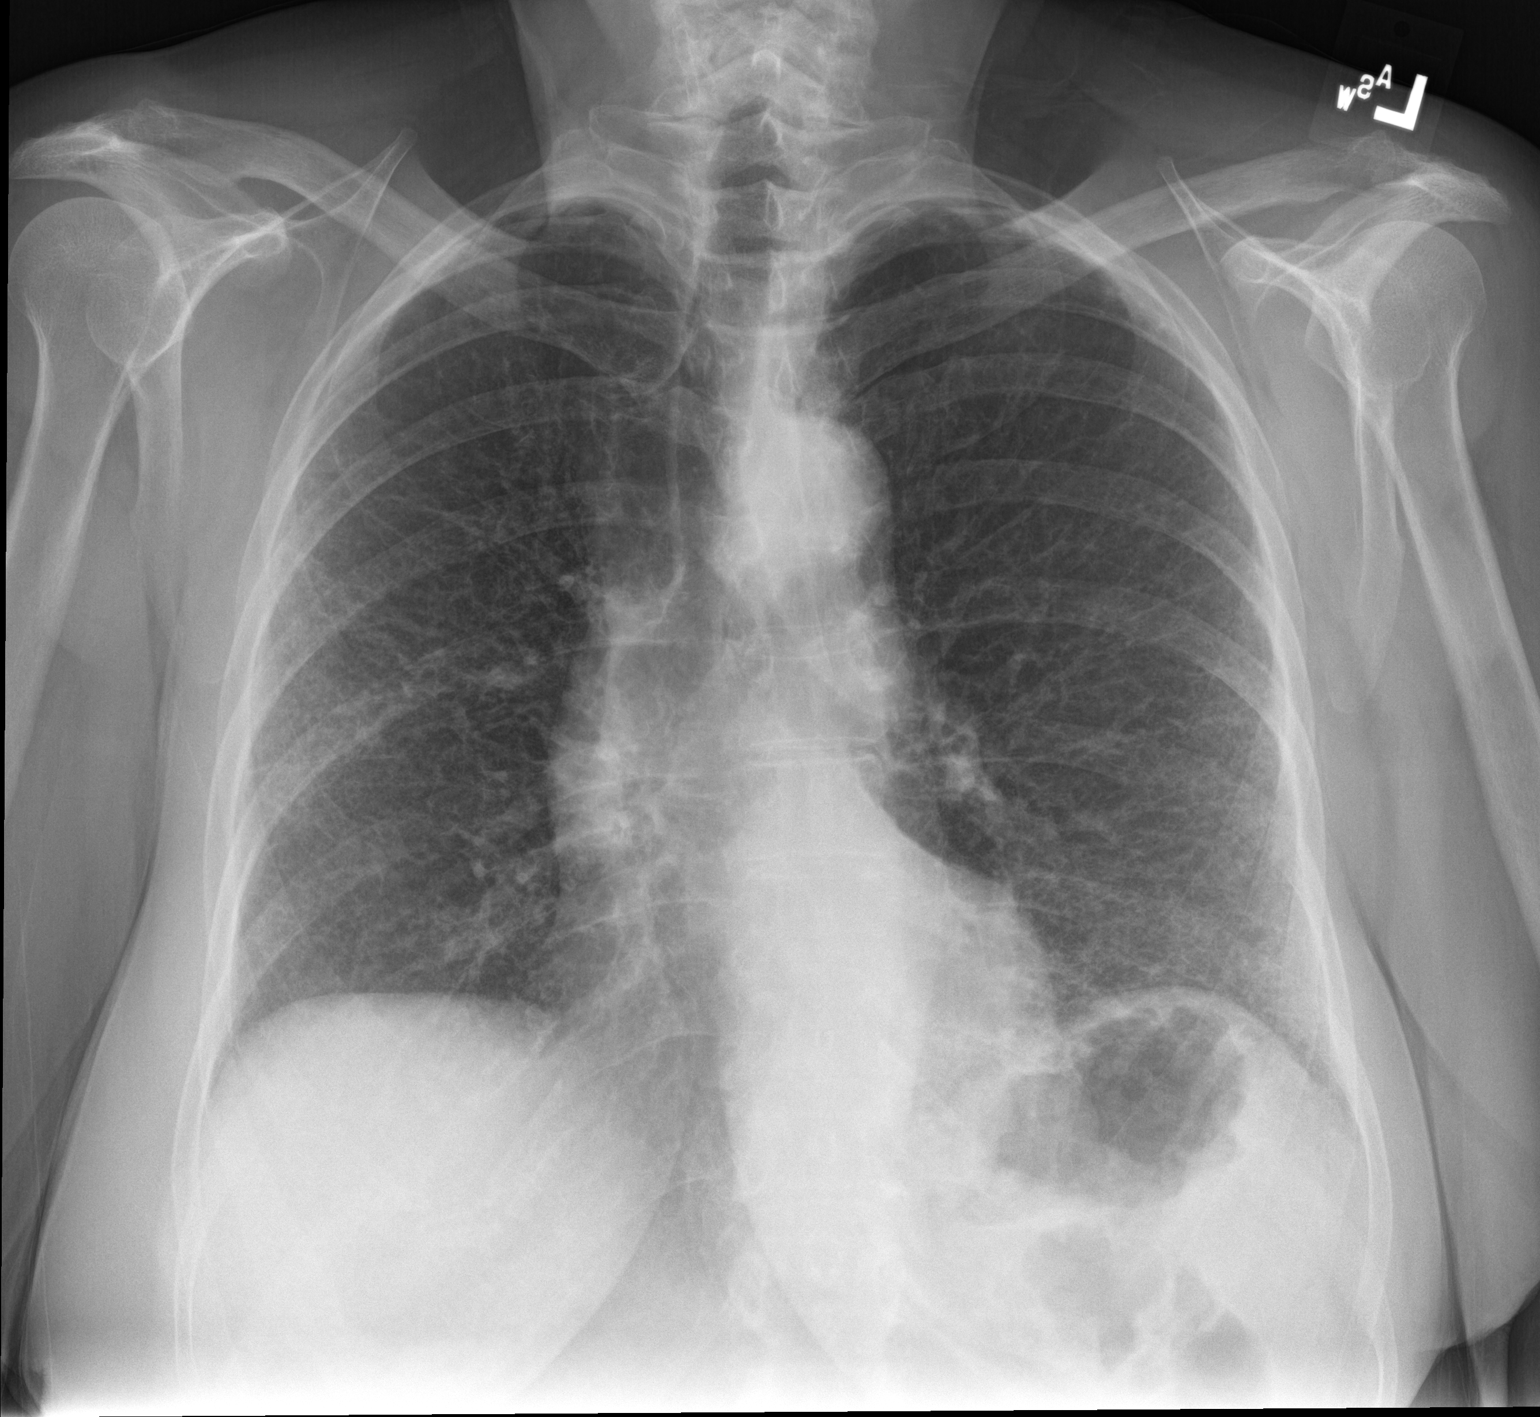

[2 of 2 positions shown; findings below may reference images not displayed]

FINDINGS: The cardiac silhouette, mediastinal and hilar contours are normal.
Mild tortuosity of the thoracic aorta. There are peripheral
bronchitic changes with increased interstitial markings. These
appear progressive since the prior study and may represent
progressive interstitial lung disease. High-resolution chest CT may
be helpful for further evaluation. No definite infiltrates or
effusions. The bony thorax is intact.
IMPRESSION: Bronchitic lung changes with progressive appearing peripheral
interstitial lung disease. High-resolution chest CT may be helpful
for further evaluation.

No definite infiltrates or effusions.
